# Patient Record
Sex: Female | Born: 1965
Health system: Southern US, Community
[De-identification: ages and names within clinical notes are randomized; demographics above are authoritative.]

## PROBLEM LIST (undated history)

## (undated) DIAGNOSIS — R87629 Unspecified abnormal cytological findings in specimens from vagina: Secondary | ICD-10-CM

## (undated) HISTORY — DX: Unspecified abnormal cytological findings in specimens from vagina: R87.629

## (undated) HISTORY — PX: CHOLECYSTECTOMY: SHX55

---

## 2018-05-16 ENCOUNTER — Other Ambulatory Visit: Payer: Self-pay

## 2018-05-16 ENCOUNTER — Emergency Department (HOSPITAL_COMMUNITY)
Admission: EM | Admit: 2018-05-16 | Discharge: 2018-05-16 | Disposition: A | Discharge status: Home / Self Care | Payer: Self-pay | Attending: Emergency Medicine | Admitting: Emergency Medicine

## 2018-05-16 ENCOUNTER — Emergency Department (HOSPITAL_COMMUNITY): Payer: Self-pay

## 2018-05-16 ENCOUNTER — Encounter (HOSPITAL_COMMUNITY): Payer: Self-pay | Admitting: *Deleted

## 2018-05-16 DIAGNOSIS — R197 Diarrhea, unspecified: Secondary | ICD-10-CM | POA: Insufficient documentation

## 2018-05-16 DIAGNOSIS — R319 Hematuria, unspecified: Secondary | ICD-10-CM

## 2018-05-16 DIAGNOSIS — F17290 Nicotine dependence, other tobacco product, uncomplicated: Secondary | ICD-10-CM | POA: Insufficient documentation

## 2018-05-16 DIAGNOSIS — N390 Urinary tract infection, site not specified: Secondary | ICD-10-CM | POA: Insufficient documentation

## 2018-05-16 LAB — CBC
HCT: 34.4 % — ABNORMAL LOW (ref 36.0–46.0)
Hemoglobin: 10.5 g/dL — ABNORMAL LOW (ref 12.0–15.0)
MCH: 27.4 pg (ref 26.0–34.0)
MCHC: 30.5 g/dL (ref 30.0–36.0)
MCV: 89.8 fL (ref 80.0–100.0)
Platelets: 360 10*3/uL (ref 150–400)
RBC: 3.83 MIL/uL — ABNORMAL LOW (ref 3.87–5.11)
RDW: 13 % (ref 11.5–15.5)
WBC: 6.2 10*3/uL (ref 4.0–10.5)
nRBC: 0 % (ref 0.0–0.2)

## 2018-05-16 LAB — COMPREHENSIVE METABOLIC PANEL
ALT: 16 U/L (ref 0–44)
AST: 16 U/L (ref 15–41)
Albumin: 3.9 g/dL (ref 3.5–5.0)
Alkaline Phosphatase: 71 U/L (ref 38–126)
Anion gap: 8 (ref 5–15)
BUN: 17 mg/dL (ref 6–20)
CO2: 24 mmol/L (ref 22–32)
Calcium: 9.1 mg/dL (ref 8.9–10.3)
Chloride: 109 mmol/L (ref 98–111)
Creatinine, Ser: 0.71 mg/dL (ref 0.44–1.00)
GFR calc Af Amer: >60 mL/min (ref >60)
GFR calc non Af Amer: >60 mL/min (ref >60)
Glucose, Bld: 85 mg/dL (ref 70–99)
Potassium: 4 mmol/L (ref 3.5–5.1)
Sodium: 141 mmol/L (ref 135–145)
Total Bilirubin: 0.6 mg/dL (ref 0.3–1.2)
Total Protein: 6.7 g/dL (ref 6.5–8.1)

## 2018-05-16 LAB — LIPASE, BLOOD: Lipase: 34 U/L (ref 11–51)

## 2018-05-16 LAB — URINALYSIS, ROUTINE W REFLEX MICROSCOPIC
Bilirubin Urine: NEGATIVE
Glucose, UA: NEGATIVE mg/dL
Ketones, ur: NEGATIVE mg/dL
Nitrite: NEGATIVE
Protein, ur: NEGATIVE mg/dL
Specific Gravity, Urine: 1.024 (ref 1.005–1.030)
pH: 5 (ref 5.0–8.0)

## 2018-05-16 LAB — TYPE AND SCREEN
ABO/RH(D): A POS
Antibody Screen: NEGATIVE

## 2018-05-16 LAB — ABO/RH: ABO/RH(D): A POS

## 2018-05-16 MED ORDER — CIPROFLOXACIN IN D5W 400 MG/200ML IV SOLN
400.0000 mg | Freq: Once | INTRAVENOUS | Status: AC
  Administered 2018-05-16: 400 mg via INTRAVENOUS
  Filled 2018-05-16: qty 200

## 2018-05-16 MED ORDER — SODIUM CHLORIDE 0.9% FLUSH: 3.0000 mL | Freq: Once | INTRAVENOUS | Status: DC

## 2018-05-16 MED ORDER — IOPAMIDOL (ISOVUE-300) INJECTION 61%
INTRAVENOUS | Status: AC
  Filled 2018-05-16: qty 100

## 2018-05-16 MED ORDER — CIPROFLOXACIN HCL 500 MG PO TABS: 500.0000 mg | ORAL_TABLET | Freq: Two times a day (BID) | ORAL | 0 refills | Status: DC

## 2018-05-16 MED ORDER — IOPAMIDOL (ISOVUE-300) INJECTION 61%
100.0000 mL | Freq: Once | INTRAVENOUS | Status: AC | PRN
  Administered 2018-05-16: 100 mL via INTRAVENOUS

## 2018-05-16 MED ORDER — OXYCODONE-ACETAMINOPHEN 5-325 MG PO TABS: 1.0000 | ORAL_TABLET | Freq: Three times a day (TID) | ORAL | 0 refills | Status: DC | PRN

## 2018-05-16 MED ORDER — SODIUM CHLORIDE (PF) 0.9 % IJ SOLN
INTRAMUSCULAR | Status: AC
  Filled 2018-05-16: qty 50

## 2018-05-16 MED ORDER — HYDROMORPHONE HCL 1 MG/ML IJ SOLN
1.0000 mg | Freq: Once | INTRAMUSCULAR | Status: AC
  Administered 2018-05-16: 1 mg via INTRAVENOUS
  Filled 2018-05-16: qty 1

## 2018-05-16 MED ORDER — SODIUM CHLORIDE 0.9 % IV BOLUS
1000.0000 mL | Freq: Once | INTRAVENOUS | Status: AC
  Administered 2018-05-16: 1000 mL via INTRAVENOUS

## 2018-05-16 MED ORDER — ONDANSETRON HCL 4 MG PO TABS: 4.0000 mg | ORAL_TABLET | Freq: Four times a day (QID) | ORAL | 0 refills | Status: DC

## 2018-05-16 NOTE — ED Notes (Signed)
Pt returned to room from CT

## 2018-05-16 NOTE — ED Triage Notes (Addendum)
Pt complains of bloody diarrhea, chills, left sided abdominal pain, nausea since yesterday evening after eating.

## 2018-05-16 NOTE — ED Provider Notes (Signed)
[facility]   CSN: [id number] Arrival date & time: 05/16/18  1129    History   Chief Complaint Chief Complaint  Patient presents with  . Rectal Bleeding  . Abdominal Pain  . Chills    HPI Wendy Miller is a 53 y.o. female.   HPI   52 year old female with several complaints.  Crampy lower abdominal pain.  Onset yesterday.  Persistent since then.  Associated with bloody diarrhea.  No fevers or chills.  Mild nausea.  No vomiting.  Dysuria and increased urinary frequency.  No sick contacts.  She is not anticoagulated.  Denies significant recent travel history, antibiotic usage.   History reviewed. No pertinent past medical history.  There are no active problems to display for this patient.   Past Surgical History:  Procedure Laterality Date  . CHOLECYSTECTOMY       OB History   No obstetric history on file.      Home Medications    Prior to Admission medications   Not on File    Family History No family history on file.  Social History Social History   Tobacco Use  . Smoking status: Current Every Day Smoker  . Smokeless tobacco: Never Used  Substance Use Topics  . Alcohol use: Not Currently  . Drug use: Not on file     Allergies   Patient has no allergy information on record.   Review of Systems Review of Systems  All systems reviewed and negative, other than as noted in HPI.  Physical Exam Updated Vital Signs BP 103/66 (BP Location: Right Arm)   Pulse 93   Temp 98.1 F (36.7 C) (Oral)   Resp 18   SpO2 98%   Physical Exam Vitals signs and nursing note reviewed.  Constitutional:      General: She is not in acute distress.    Appearance: She is well-developed.  HENT:     Head: Normocephalic and atraumatic.  Eyes:     General:        Right eye: No discharge.        Left eye: No discharge.     Conjunctiva/sclera: Conjunctivae normal.  Neck:     Musculoskeletal: Neck supple.    Cardiovascular:     Rate and Rhythm: Normal rate and regular rhythm.     Heart sounds: Normal heart sounds. No murmur. No friction rub. No gallop.   Pulmonary:     Effort: Pulmonary effort is normal. No respiratory distress.     Breath sounds: Normal breath sounds.  Abdominal:     General: There is no distension.     Palpations: Abdomen is soft.     Tenderness: There is abdominal tenderness.     Comments: Tenderness in the lower abdomen, somewhat worse suprapubically.  No rebound or guarding.  Musculoskeletal:        General: No tenderness.  Skin:    General: Skin is warm and dry.  Neurological:     Mental Status: She is alert.  Psychiatric:        Behavior: Behavior normal.        Thought Content: Thought content normal.      ED Treatments / Results  Labs (all labs ordered are listed, but only abnormal results are displayed) Labs Reviewed  URINE CULTURE - Abnormal; Notable for the following components:      Result Value   Culture >=100,000 COLONIES/mL ENTEROBACTER AEROGENES (*)    Organism ID, Bacteria ENTEROBACTER AEROGENES (*)  All other components within normal limits  CBC - Abnormal; Notable for the following components:   RBC 3.83 (*)    Hemoglobin 10.5 (*)    HCT 34.4 (*)    All other components within normal limits  URINALYSIS, ROUTINE W REFLEX MICROSCOPIC - Abnormal; Notable for the following components:   APPearance CLOUDY (*)    Hgb urine dipstick LARGE (*)    Leukocytes,Ua MODERATE (*)    Bacteria, UA MANY (*)    All other components within normal limits  LIPASE, BLOOD  COMPREHENSIVE METABOLIC PANEL  TYPE AND SCREEN  ABO/RH    EKG None  Radiology No results found.   Ct Abdomen Pelvis W Contrast  Result Date: 05/16/2018 CLINICAL DATA:  Bloody diarrhea, chills, left abdominal pain and nausea since last night after eating. EXAM: CT ABDOMEN AND PELVIS WITH CONTRAST TECHNIQUE: Multidetector CT imaging of the abdomen and pelvis was performed using  the standard protocol following bolus administration of intravenous contrast. CONTRAST:  ISOVUE-300 IOPAMIDOL (ISOVUE-300) INJECTION 61% COMPARISON:  None. FINDINGS: Lower chest: Mild dependent atelectasis bilaterally. Hepatobiliary: Cholecystectomy clips. Multiple liver cysts. Pancreas: Unremarkable. No pancreatic ductal dilatation or surrounding inflammatory changes. Spleen: Normal in size without focal abnormality. Adrenals/Urinary Tract: Adrenal glands are unremarkable. Kidneys are normal, without renal calculi, focal lesion, or hydronephrosis. Bladder is unremarkable. Stomach/Bowel: Mild diffuse fat density wall thickening involving the cecum and inferior portions of the right colon. The remainder of the colon, small bowel and stomach are unremarkable. Normal appearing appendix. There is also prominent stool in those portions of the colon Vascular/Lymphatic: Multiple mildly enlarged mesenteric lymph nodes. The largest is in the right lower quadrant, with a short axis diameter of 10 mm on image number 52 series 2. Mild atheromatous arterial calcifications without aneurysm. Reproductive: Uterus and bilateral adnexa are unremarkable. Other: Small umbilical hernia containing fat. Musculoskeletal: Mild levoconvex thoracolumbar scoliosis and minimal lumbar spine degenerative changes. IMPRESSION: 1. Mild diffuse fat density wall thickening involving the cecum and inferior portions of the right colon. This can be seen with previous colitis. There is no evidence of active colitis. 2. Mild mesenteric adenopathy, most likely reactive. Electronically Signed   By: Beckie Salts M.D.   On: 05/16/2018 14:43    Procedures Procedures (including critical care time)  Medications Ordered in ED Medications  ciprofloxacin (CIPRO) IVPB 400 mg (0 mg Intravenous Stopped 05/16/18 1450)  sodium chloride 0.9 % bolus 1,000 mL (0 mLs Intravenous Stopped 05/16/18 1607)  HYDROmorphone (DILAUDID) injection 1 mg (1 mg Intravenous  Given 05/16/18 1338)  iopamidol (ISOVUE-300) 61 % injection 100 mL (100 mLs Intravenous Contrast Given 05/16/18 1403)     Initial Impression / Assessment and Plan / ED Course  I have reviewed the triage vital signs and the nursing notes.  Pertinent labs & imaging results that were available during my care of the patient were reviewed by me and considered in my medical decision making (see chart for details).       53 year old female with dysuria and bloody diarrhea.  UA is consistent with UTI.  She will be placed on Cipro which will cover for possible bacterial infectious diarrhea as well.  She is afebrile.  Hemodynamically stable.  I feel she is appropriate for outpatient treatment.  Urine pain and nausea medicine.  Return precautions discussed.  Final Clinical Impressions(s) / ED Diagnoses   Final diagnoses:  Urinary tract infection with hematuria, site unspecified  Bloody diarrhea    ED Discharge Orders  Ordered    ciprofloxacin (CIPRO) 500 MG tablet  2 times daily     05/16/18 1553    oxyCODONE-acetaminophen (PERCOCET/ROXICET) 5-325 MG tablet  Every 8 hours PRN     05/16/18 1553    ondansetron (ZOFRAN) 4 MG tablet  Every 6 hours     05/16/18 1553           Raeford Razor, MD 05/21/18 1244

## 2018-05-16 NOTE — ED Notes (Signed)
Patient transported to CT 

## 2018-05-16 NOTE — ED Notes (Signed)
This RN attempted to gain IV access x2 unsuccessfully. Spencer RN to attempt to gain [place] IV access. Pt tolerated both attempts well and agrees to ultrasound IV attempt.

## 2018-05-19 LAB — URINE CULTURE: Culture: >=100000 — AB

## 2018-05-20 ENCOUNTER — Telehealth: Payer: Self-pay | Admitting: Emergency Medicine

## 2018-05-20 NOTE — Telephone Encounter (Signed)
Post ED Visit - Positive Culture Follow-up  Culture report reviewed by antimicrobial stewardship pharmacist: [facility] []  Enzo Bi, Pharm.D. []  Celedonio Miyamoto, Pharm.D., BCPS AQ-ID []  Garvin Fila, Pharm.D., BCPS []  Georgina Pillion, Pharm.D., BCPS []  [place], [place].D., BCPS, AAHIVP []  Estella Husk, Pharm.D., BCPS, AAHIVP []  Lysle Pearl, PharmD, BCPS []  Phillips Climes, PharmD, BCPS []  Agapito Games, PharmD, BCPS []  Verlan Friends, PharmD []  Mervyn Gay, PharmD, BCPS []  Vinnie Level, PharmD  [facility] [x]  Len Childs, PharmD []  Greer Pickerel, PharmD []  Adalberto Cole, PharmD []  Perlie Gold, Rph []  Lonell Face) Jean Rosenthal, PharmD []  Earl Many, PharmD []  Junita Push, PharmD []  Dorna Leitz, PharmD []  Terrilee Files, PharmD []  Lynann Beaver, PharmD []  Keturah Barre, PharmD []  Loralee Pacas, PharmD []  Bernadene Person, PharmD   Positive urine culture Treated with ciprofloxacin, organism sensitive to the same and no further patient follow-up is required at this time.  Berle Mull 05/20/2018, 1:33 PM

## 2018-06-17 ENCOUNTER — Other Ambulatory Visit: Payer: Self-pay

## 2018-06-17 ENCOUNTER — Emergency Department (HOSPITAL_COMMUNITY)
Admission: EM | Admit: 2018-06-17 | Discharge: 2018-06-17 | Disposition: A | Discharge status: Home / Self Care | Payer: No Typology Code available for payment source | Attending: Emergency Medicine | Admitting: Emergency Medicine

## 2018-06-17 ENCOUNTER — Emergency Department (HOSPITAL_COMMUNITY): Payer: No Typology Code available for payment source

## 2018-06-17 ENCOUNTER — Encounter (HOSPITAL_COMMUNITY): Payer: Self-pay

## 2018-06-17 DIAGNOSIS — M542 Cervicalgia: Secondary | ICD-10-CM | POA: Diagnosis present

## 2018-06-17 DIAGNOSIS — F17200 Nicotine dependence, unspecified, uncomplicated: Secondary | ICD-10-CM | POA: Insufficient documentation

## 2018-06-17 DIAGNOSIS — Z79899 Other long term (current) drug therapy: Secondary | ICD-10-CM | POA: Diagnosis not present

## 2018-06-17 DIAGNOSIS — M25511 Pain in right shoulder: Secondary | ICD-10-CM | POA: Diagnosis not present

## 2018-06-17 DIAGNOSIS — Y999 Unspecified external cause status: Secondary | ICD-10-CM | POA: Insufficient documentation

## 2018-06-17 DIAGNOSIS — Y9389 Activity, other specified: Secondary | ICD-10-CM | POA: Insufficient documentation

## 2018-06-17 DIAGNOSIS — Y92410 Unspecified street and highway as the place of occurrence of the external cause: Secondary | ICD-10-CM | POA: Insufficient documentation

## 2018-06-17 MED ORDER — METHOCARBAMOL 500 MG PO TABS: 500.0000 mg | ORAL_TABLET | Freq: Two times a day (BID) | ORAL | 0 refills | Status: DC

## 2018-06-17 MED ORDER — LIDOCAINE 5 % EX PTCH: 1.0000 | MEDICATED_PATCH | CUTANEOUS | 0 refills | Status: DC
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
## 2018-06-17 NOTE — Discharge Instructions (Signed)
You have been seen today for a shoulder injury. There were no acute abnormalities on the x-rays, including no sign of fracture or dislocation, however, there could be injuries to the soft tissues, such as the ligaments or tendons that are not seen on xrays. There could also be what are called occult fractures that are small fractures not seen on xray. Antiinflammatory medications: Take 600 mg of ibuprofen every 6 hours or 440 mg (over the counter dose) to 500 mg (prescription dose) of naproxen every 12 hours for the next 3 days. After this time, these medications may be used as needed for pain. Take these medications with food to avoid upset stomach. Choose only one of these medications, do not take them together. Acetaminophen (generic for Tylenol): Should you continue to have additional pain while taking the ibuprofen or naproxen, you may add in acetaminophen as needed. Your daily total maximum amount of acetaminophen from all sources should be limited to 4000mg /day for persons without liver problems, or 2000mg /day for those with liver problems. Robaxin: Robaxin is a muscle relaxer and can help relieve stiff muscles or muscle spasms.  Do not drive or perform other dangerous activities while taking the Robaxin. Ice: May apply ice to the area over the next 24 hours for 15 minutes at a time to reduce swelling. Elevation: Keep the extremity elevated as often as possible to reduce pain and inflammation. Support: Wear the shoulder sling for support and comfort. Wear this until pain resolves. Be sure to remove your arm from the sling multiple times a day and work it through the range of motion of the shoulder to avoid a condition called adhesive capsulitis (frozen shoulder). Exercises: Start by performing these exercises a few times a week, increasing the frequency until you are performing them twice daily.  Follow up: If symptoms are improving, you may follow up with your primary care provider for any continued  management. If symptoms are not starting to improve within a week, you should follow up with the orthopedic specialist within two weeks. Return: Return to the ED for numbness, weakness, increasing pain, overall worsening symptoms, loss of function, or if symptoms are not improving, you have tried to follow up with the orthopedic specialist, and have been unable to do so.  For prescription assistance, may try using prescription discount sites or apps, such as goodrx.com
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
## 2018-06-17 NOTE — ED Notes (Signed)
Pt c/o neck/shoulder pain. Pt given cervical collar

## 2019-10-23 ENCOUNTER — Emergency Department (HOSPITAL_COMMUNITY): Payer: Self-pay

## 2019-10-23 ENCOUNTER — Other Ambulatory Visit: Payer: Self-pay

## 2019-10-23 ENCOUNTER — Encounter (HOSPITAL_COMMUNITY): Payer: Self-pay

## 2019-10-23 ENCOUNTER — Emergency Department (HOSPITAL_COMMUNITY)
Admission: EM | Admit: 2019-10-23 | Discharge: 2019-10-23 | Disposition: A | Discharge status: Home / Self Care | Payer: Self-pay | Attending: Emergency Medicine | Admitting: Emergency Medicine

## 2019-10-23 DIAGNOSIS — J302 Other seasonal allergic rhinitis: Secondary | ICD-10-CM

## 2019-10-23 DIAGNOSIS — F17200 Nicotine dependence, unspecified, uncomplicated: Secondary | ICD-10-CM | POA: Insufficient documentation

## 2019-10-23 MED ORDER — AZELASTINE HCL 0.05 % OP SOLN: 1.0000 [drp] | Freq: Two times a day (BID) | OPHTHALMIC | 2 refills | Status: DC

## 2019-10-23 NOTE — ED Provider Notes (Signed)
[facility]-EMERGENCY DEPT Provider Note   CSN: [id number] Arrival date & time: 10/23/19  2671     History Chief Complaint  Patient presents with  . Eye Problem  . Headache    Wendy Miller is a 54 y.o. female.  HPI She complains of watering from her left eye, for 1 week.  She awoke this morning without left periorbital headache.  Several months ago she was diagnosed with Bell's palsy, at which time she had facial weakness and drainage from her left eye.  The drainage from the left eye lasted a couple weeks at that time then resolved.  She denies sinus symptoms currently.  She denies fever, chills, nausea, vomiting, weakness, dizziness, paresthesia.  There are no other known modifying factors.    History reviewed. No pertinent past medical history.  There are no problems to display for this patient.   Past Surgical History:  Procedure Laterality Date  . CHOLECYSTECTOMY       OB History   No obstetric history on file.     History reviewed. No pertinent family history.  Social History   Tobacco Use  . Smoking status: Current Every Day Smoker  . Smokeless tobacco: Never Used  Substance Use Topics  . Alcohol use: Not Currently  . Drug use: Not on file    Home Medications Prior to Admission medications   Medication Sig Start Date End Date Taking? Authorizing Provider  azelastine (OPTIVAR) 0.05 % ophthalmic solution Place 1 drop into both eyes 2 (two) times daily. 10/23/19   Mancel Bale, MD  ciprofloxacin (CIPRO) 500 MG tablet Take 1 tablet (500 mg total) by mouth 2 (two) times daily. Patient not taking: Reported on 10/23/2019 05/16/18   Raeford Razor, MD  lidocaine (LIDODERM) 5 % Place 1 patch onto the skin daily. Remove & Discard patch within 12 hours or as directed by MD Patient not taking: Reported on 10/23/2019 06/17/18   Joy, Hillard Danker, PA-C  methocarbamol (ROBAXIN) 500 MG tablet Take 1 tablet (500 mg total) by mouth 2 (two) times daily. Patient  not taking: Reported on 10/23/2019 06/17/18   Joy, Shawn C, PA-C  ondansetron (ZOFRAN) 4 MG tablet Take 1 tablet (4 mg total) by mouth every 6 (six) hours. Patient not taking: Reported on 10/23/2019 05/16/18   Raeford Razor, MD  oxyCODONE-acetaminophen (PERCOCET/ROXICET) 5-325 MG tablet Take 1 tablet by mouth every 8 (eight) hours as needed for severe pain. Patient not taking: Reported on 10/23/2019 05/16/18   Raeford Razor, MD    Allergies    Patient has no known allergies.  Review of Systems   Review of Systems  All other systems reviewed and are negative.   Physical Exam Updated Vital Signs BP 107/89 (BP Location: Left Arm)   Pulse 63   Temp 98.4 F (36.9 C) (Oral)   Resp 17   SpO2 99%   Physical Exam Vitals and nursing note reviewed.  Constitutional:      General: She is not in acute distress.    Appearance: She is well-developed. She is not ill-appearing, toxic-appearing or diaphoretic.  HENT:     Head: Normocephalic and atraumatic.     Right Ear: External ear normal.     Left Ear: External ear normal.     Nose: Nose normal.     Mouth/Throat:     Mouth: Mucous membranes are moist.     Pharynx: No oropharyngeal exudate or posterior oropharyngeal erythema.  Eyes:     Conjunctiva/sclera: Conjunctivae normal.  Pupils: Pupils are equal, round, and reactive to light.     Comments: Mild tearing left eye.  No hyphema, no conjunctivitis, no purulent discharge.  External ocular muscles are intact.  No pain with eye movement.  Neck:     Trachea: Phonation normal.  Cardiovascular:     Rate and Rhythm: Normal rate and regular rhythm.     Heart sounds: Normal heart sounds.  Pulmonary:     Effort: Pulmonary effort is normal.     Breath sounds: Normal breath sounds.  Abdominal:     General: There is no distension.  Musculoskeletal:        General: No swelling or tenderness. Normal range of motion.     Cervical back: Normal range of motion and neck supple.  Skin:    General:  Skin is warm and dry.     Coloration: Skin is not jaundiced or pale.  Neurological:     Mental Status: She is alert and oriented to person, place, and time.     Cranial Nerves: No cranial nerve deficit.     Sensory: No sensory deficit.     Motor: No abnormal muscle tone.     Coordination: Coordination normal.  Psychiatric:        Mood and Affect: Mood normal.        Behavior: Behavior normal.        Thought Content: Thought content normal.        Judgment: Judgment normal.     ED Results / Procedures / Treatments   Labs (all labs ordered are listed, but only abnormal results are displayed) Labs Reviewed - No data to display  EKG None  Radiology CT Head Wo Contrast  Result Date: 10/23/2019 CLINICAL DATA:  Headache.  Left eye drainage. EXAM: CT HEAD WITHOUT CONTRAST TECHNIQUE: Contiguous axial images were obtained from the base of the skull through the vertex without intravenous contrast. COMPARISON:  None. FINDINGS: Brain: The brain shows a normal appearance without evidence of malformation, atrophy, old or acute small or large vessel infarction, mass lesion, hemorrhage, hydrocephalus or extra-axial collection. Vascular: There is atherosclerotic calcification of the major vessels at the base of the brain. Skull: Normal.  No traumatic finding.  No focal bone lesion. Sinuses/Orbits: Sinuses are clear. Orbits appear normal. No evidence of left orbital pathology by CT. Mastoids are clear. Other: None significant IMPRESSION: Normal head CT. No abnormality seen to explain headache. There is atherosclerotic calcification of the major vessels at the base of the brain. Electronically Signed   By: Paulina Fusi M.D.   On: 10/23/2019 13:03    Procedures Procedures (including critical care time)  Medications Ordered in ED Medications - No data to display  ED Course  I have reviewed the triage vital signs and the nursing notes.  Pertinent labs & imaging results that were available during my  care of the patient were reviewed by me and considered in my medical decision making (see chart for details).    MDM Rules/Calculators/A&P                           Patient Vitals for the past 24 hrs:  BP Temp Temp src Pulse Resp SpO2  10/23/19 1300 107/89 -- -- -- 17 99 %  10/23/19 1149 (!) 147/93 98.4 F (36.9 C) Oral 63 16 99 %  10/23/19 0846 (!) 140/99 98.4 F (36.9 C) Oral 73 16 94 %    At discharge reevaluation  with update and discussion. After initial assessment and treatment, an updated evaluation reveals she is comfortable has no further complaints. Mancel Bale   Medical Decision Making:  This patient is presenting for evaluation of drainage from right eye and headache, which does require a range of treatment options, and is a complaint that involves a moderate risk of morbidity and mortality. The differential diagnoses include sinusitis, allergic reaction migraine headache. I decided to review old records, and in summary patient with recurrent headache presenting with eye drainage, and normal neurologic exam.  I did not require additional historical information from anyone.   Radiologic Tests Ordered, included CT head.  I independently Visualized: Radiographic images, which show no sinusitis or intracranial abnormality    Critical Interventions-clinical evaluation, CT imaging, observation reassessment  After These Interventions, the Patient was reevaluated and was found stable for discharge.  No evidence for CVA, sinusitis, Bell's palsy, or acute bacterial infection  CRITICAL CARE-no Performed by: Mancel Bale  Nursing Notes Reviewed/ Care Coordinated Applicable Imaging Reviewed Interpretation of Laboratory Data incorporated into ED treatment  The patient appears reasonably screened and/or stabilized for discharge and I doubt any other medical condition or other [facility] requiring further screening, evaluation, or treatment in the ED at this time prior to  discharge.  Plan: Home Medications-symptomatic treatment as needed; Home Treatments-rest, fluids; return here if the recommended treatment, does not improve the symptoms; Recommended follow up-PCP, as needed     Final Clinical Impression(s) / ED Diagnoses Final diagnoses:  Seasonal allergies    Rx / DC Orders ED Discharge Orders         Ordered    azelastine (OPTIVAR) 0.05 % ophthalmic solution  2 times daily     Discontinue  Reprint     10/23/19 1359           Mancel Bale, MD 10/23/19 [phone]

## 2019-10-23 NOTE — Discharge Instructions (Signed)
It appears that your high drainage is from allergy.  We are prescribing an antihistamine drop to help that discomfort.  You can use a warm compress on the area of discomfort 2 or 3 times a day which should help.  Follow-up with your primary care doctor if not better in a few days.

## 2019-10-23 NOTE — ED Triage Notes (Signed)
Pt reports c/o left eye drainage for a week and a half. Pt also complaining of headache on left side. History of Bells palsy

## 2020-08-30 ENCOUNTER — Encounter (HOSPITAL_COMMUNITY): Payer: Self-pay

## 2020-08-30 ENCOUNTER — Emergency Department (HOSPITAL_COMMUNITY)
Admission: EM | Admit: 2020-08-30 | Discharge: 2020-08-30 | Disposition: A | Discharge status: Home / Self Care | Payer: No Typology Code available for payment source | Attending: Emergency Medicine | Admitting: Emergency Medicine

## 2020-08-30 ENCOUNTER — Emergency Department (HOSPITAL_COMMUNITY): Payer: No Typology Code available for payment source

## 2020-08-30 ENCOUNTER — Other Ambulatory Visit: Payer: Self-pay

## 2020-08-30 DIAGNOSIS — M25562 Pain in left knee: Secondary | ICD-10-CM | POA: Diagnosis present

## 2020-08-30 DIAGNOSIS — M25469 Effusion, unspecified knee: Secondary | ICD-10-CM

## 2020-08-30 DIAGNOSIS — F17210 Nicotine dependence, cigarettes, uncomplicated: Secondary | ICD-10-CM | POA: Insufficient documentation

## 2020-08-30 MED ORDER — DICLOFENAC SODIUM 1 % EX GEL: 2.0000 g | Freq: Four times a day (QID) | CUTANEOUS | 0 refills | Status: DC

## 2020-08-30 NOTE — ED Provider Notes (Signed)
[facility]-EMERGENCY DEPT Provider Note   CSN: [id number] Arrival date & time: 08/30/20  1349     History Chief Complaint  Patient presents with   Joint Swelling    Wendy Miller is a 55 y.o. female with no pertinent past medical history.  Patient presents to the emerged part with a chief complaint of pain and swelling to her left knee.  Pain and swelling started on Saturday 6/18.  Symptoms have gradually worsened since then.  Patient rates pain 8/10 on the pain scale.  Pain is worse with movement or touch.  Patient has had minimal improvement with Motrin.  Patient denies any recent falls or injuries.  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  She denies any fevers or chills.  HPI     History reviewed. No pertinent past medical history.  There are no problems to display for this patient.   Past Surgical History:  Procedure Laterality Date   CESAREAN SECTION     CHOLECYSTECTOMY       OB History   No obstetric history on file.     Family History  Problem Relation Age of Onset   Diabetes Father    Hypertension Father     Social History   Tobacco Use   Smoking status: Every Day    Packs/day: 0.50    Pack years: 0.00    Types: Cigarettes   Smokeless tobacco: Never  Vaping Use   Vaping Use: Never used  Substance Use Topics   Alcohol use: Not Currently   Drug use: Never    Home Medications Prior to Admission medications   Medication Sig Start Date End Date Taking? Authorizing Provider  azelastine (OPTIVAR) 0.05 % ophthalmic solution Place 1 drop into both eyes 2 (two) times daily. 10/23/19   Mancel Bale, MD  ciprofloxacin (CIPRO) 500 MG tablet Take 1 tablet (500 mg total) by mouth 2 (two) times daily. Patient not taking: Reported on 10/23/2019 05/16/18   Raeford Razor, MD  lidocaine  (LIDODERM) 5 % Place 1 patch onto the skin daily. Remove & Discard patch within 12 hours or as directed by MD Patient not taking: Reported on 10/23/2019 06/17/18   Joy, Hillard Danker, PA-C  methocarbamol (ROBAXIN) 500 MG tablet Take 1 tablet (500 mg total) by mouth 2 (two) times daily. Patient not taking: Reported on 10/23/2019 06/17/18   Joy, Shawn C, PA-C  ondansetron (ZOFRAN) 4 MG tablet Take 1 tablet (4 mg total) by mouth every 6 (six) hours. Patient not taking: Reported on 10/23/2019 05/16/18   Raeford Razor, MD  oxyCODONE-acetaminophen (PERCOCET/ROXICET) 5-325 MG tablet Take 1 tablet by mouth every 8 (eight) hours as needed for severe pain. Patient not taking: Reported on 10/23/2019 05/16/18   Raeford Razor, MD    Allergies    Patient has no known allergies.  Review of Systems   Review of Systems  Constitutional:  Negative for chills and fever.  Musculoskeletal:  Positive for arthralgias and joint swelling.  Skin:  Negative for color change, pallor, rash and wound.  Neurological:  Negative for weakness and numbness.   Physical Exam Updated Vital Signs BP (!) 149/94 (BP Location: Left Arm)   Pulse 62   Temp 98.3 F (36.8 C) (Oral)   Resp 18   Ht 5\' 8"  (1.727 m)   Wt 83.5 kg  SpO2 92%   BMI 27.98 kg/m   Physical Exam Vitals and nursing note reviewed.  Constitutional:      General: She is not in acute distress.    Appearance: She is not ill-appearing, toxic-appearing or diaphoretic.  HENT:     Head: Normocephalic.  Eyes:     General: No scleral icterus.       Right eye: No discharge.        Left eye: No discharge.  Cardiovascular:     Rate and Rhythm: Normal rate.     Pulses:          Dorsalis pedis pulses are 3+ on the right side.  Pulmonary:     Effort: Pulmonary effort is normal.  Musculoskeletal:     Left knee: Swelling, effusion and bony tenderness present. No deformity, erythema, ecchymosis, lacerations or crepitus. Normal range of motion. Tenderness (diffuse throughout  knee) present. Normal alignment.     Right lower leg: Normal.     Left lower leg: Normal.     Right ankle: No swelling, deformity, ecchymosis or lacerations. No tenderness. Normal range of motion.     Left ankle: No swelling, deformity, ecchymosis or lacerations. No tenderness. Normal range of motion.     Left foot: Normal range of motion and normal capillary refill. No swelling, deformity, laceration, tenderness or bony tenderness. Normal pulse.     Comments: She has full extension and flexion to left knee.  No palpable defect to quadriceps tendon or patella tendon.  Feet:     Right foot:     Skin integrity: Skin integrity normal.     Toenail Condition: Right toenails are normal.  Skin:    General: Skin is warm and dry.  Neurological:     General: No focal deficit present.     Mental Status: She is alert.  Psychiatric:        Behavior: Behavior is cooperative.    ED Results / Procedures / Treatments   Labs (all labs ordered are listed, but only abnormal results are displayed) Labs Reviewed - No data to display  EKG None  Radiology DG Knee Complete 4 Views Left  Result Date: 08/30/2020 CLINICAL DATA:  LEFT knee swelling since last night, no known injury EXAM: LEFT KNEE - COMPLETE 4+ VIEW COMPARISON:  None FINDINGS: Osseous mineralization low normal. Joint spaces appear preserved though a few subchondral cystic changes are present at the lateral femoral condyle. Small knee joint effusion present. No acute fracture or dislocation. IMPRESSION: Mild degenerative changes at the patellofemoral joint with associated joint effusion. No acute osseous abnormalities. Electronically Signed   By: Ulyses Southward M.D.   On: 08/30/2020 16:24    Procedures Procedures   Medications Ordered in ED Medications - No data to display  ED Course  I have reviewed the triage vital signs and the nursing notes.  Pertinent labs & imaging results that were available during my care of the patient were  reviewed by me and considered in my medical decision making (see chart for details).    MDM Rules/Calculators/A&P                          Alert 55 year old female no acute stress, nontoxic-appearing.  Patient presents to the emergency department with a chief complaint of pain and swelling to left knee.  Symptoms have been present since 6/18.    Patient denies any erythema or systemic symptoms.  On physical exam patient has no  erythema or warmth noted to the left knee.  Low suspicion for septic arthritis at this time.  Patient has full range of motion to left knee.  Patient has diffuse tenderness and effusion throughout left knee.  Pulse, motor, and sensation intact distally.  X-ray obtained shows mild degenerative changes at the patellofemoral joint with associated joint effusion.  No acute osseous abnormalities.  Will give patient knee brace.  Patient prescribed Voltaren gel.  Patient advised to elevate and ice knee as much as possible.  We will have patient follow-up with [facility].  Patient given strict return precautions.  Patient expressed understanding of all instructions and is agreeable with this plan.  Final Clinical Impression(s) / ED Diagnoses Final diagnoses:  Acute pain of left knee  Knee swelling    Rx / DC Orders ED Discharge Orders          Ordered    diclofenac Sodium (VOLTAREN) 1 % GEL  4 times daily        08/30/20 1829             Berneice Heinrich 08/30/20 1836    Lorre Nick, MD 09/02/20 1546

## 2020-08-30 NOTE — ED Triage Notes (Signed)
Patient c/o left knee swelling since last night. Patient denies any specific injury.

## 2020-08-30 NOTE — Discharge Instructions (Addendum)
You came to the emerge apartment today to be evaluated for your left knee pain and swelling.  Your physical exam was reassuring.  Your x-ray showed mild degenerative changes and some fluid around your knee.  Please follow-up with the orthopedic provider have given you.  Get help right away if: You have swelling or redness in your knee that gets worse or does not get better. You have severe pain in your knee.

## 2020-09-29 ENCOUNTER — Other Ambulatory Visit: Payer: Self-pay | Admitting: Internal Medicine

## 2020-09-29 DIAGNOSIS — Z13820 Encounter for screening for osteoporosis: Secondary | ICD-10-CM

## 2020-09-29 DIAGNOSIS — Z1231 Encounter for screening mammogram for malignant neoplasm of breast: Secondary | ICD-10-CM

## 2020-11-03 ENCOUNTER — Other Ambulatory Visit (HOSPITAL_COMMUNITY): Payer: Self-pay

## 2020-11-03 MED ORDER — DICLOFENAC SODIUM 1 % EX GEL
CUTANEOUS | 2 refills | Status: AC
  Filled 2020-11-03: qty 200, 25d supply, fill #0

## 2020-11-08 ENCOUNTER — Other Ambulatory Visit (HOSPITAL_COMMUNITY): Payer: Self-pay

## 2020-11-08 ENCOUNTER — Other Ambulatory Visit: Payer: Self-pay

## 2020-11-08 ENCOUNTER — Encounter: Payer: Self-pay | Admitting: Advanced Practice Midwife

## 2020-11-08 ENCOUNTER — Ambulatory Visit (INDEPENDENT_AMBULATORY_CARE_PROVIDER_SITE_OTHER): Payer: No Typology Code available for payment source | Admitting: Advanced Practice Midwife

## 2020-11-08 VITALS — BP 138/91 | HR 77 | Ht 67.0 in | Wt 177.0 lb

## 2020-11-08 DIAGNOSIS — R87820 Cervical low risk human papillomavirus (HPV) DNA test positive: Secondary | ICD-10-CM

## 2020-11-08 DIAGNOSIS — Z01419 Encounter for gynecological examination (general) (routine) without abnormal findings: Secondary | ICD-10-CM

## 2020-11-08 DIAGNOSIS — N951 Menopausal and female climacteric states: Secondary | ICD-10-CM | POA: Diagnosis not present

## 2020-11-08 NOTE — Progress Notes (Signed)
Subjective:     Wendy Miller is a 55 y.o. female here at [facility] for a routine exam.  Current complaints: hot flashes.  Personal health questionnaire reviewed: yes.  Do you have a primary care provider? [facility] Do you feel safe at home? Yes.  Patient experiencing hot flashes that started 1.5 years ago. Patient states her hot flashes are severe. She has tried non medical remedies including using breathable clothing, sleeping with a fan, using wet cloth during flashes, but has not had any relief. She would like to discuss treatment. Patients last LMP was 2 years ago. She has no other gynecological concerns, She denies vaginal itching or dryness. Denies vaginal bleeding or pelvic pain.    Health Maintenance Due  Topic Date Due   Pneumococcal Vaccine 83-77 Years old (1 - PCV) Never done   HIV Screening  Never done   Hepatitis C Screening  Never done   COLONOSCOPY (Pts 45-68yrs Insurance coverage will need to be confirmed)  Never done   MAMMOGRAM  Never done   Zoster Vaccines- Shingrix (1 of 2) Never done   COVID-19 Vaccine (3 - Booster) 05/06/2020   INFLUENZA VACCINE  10/11/2020     Risk factors for chronic health problems: Smoking: current smoker, 1 pack day Alchohol/how much: none Pt BMI: Body mass index is 27.72 kg/m.   Gynecologic History No LMP recorded. Patient is postmenopausal. Contraception: condoms Last Pap: 05/2019. Results were: normal Last mammogram: scheduled for november. Results were: pending  Obstetric History OB History  Gravida Para Term Preterm AB Living  3 3 3  0 0 4  SAB IAB Ectopic Multiple Live Births  0 0 0 1 3    # Outcome Date GA Lbr Len/2nd Weight Sex Delivery Anes PTL Lv  3 Term           2 Term           1 Term              The following portions of the patient's history were reviewed and updated as appropriate: current medications, past family history, past medical history, past social history, and past surgical  history.  Review of Systems Pertinent items noted in HPI and remainder of comprehensive ROS otherwise negative.    Objective:   BP (!) 138/91   Pulse 77   Ht 5\' 7"  (1.702 m)   Wt 177 lb (80.3 kg)   BMI 27.72 kg/m  VS reviewed, nursing note reviewed,  Constitutional: well developed, well nourished, no distress HEENT: normocephalic CV: normal rate Pulm/chest wall: normal effort Breast Exam:  Right breast normal without masses, skin or nipple changes or axillary nodes, left breast normal without mass, skin or nipple changes or axillary nodes Abdomen: soft Neuro: alert and oriented x 3 Skin: warm, dry Psych: affect normal Pelvic exam: Cervix pink, visually closed, without lesion, vaginal walls and external genitalia normal Bimanual exam: Cervix firm, anterior, neg CMT, uterus nontender, nonenlarged, adnexa without tenderness, enlargement, or mass     Assessment/Plan:  Hot flashes in setting of menopause -Patient does not have additional symptoms of menopause but is having severe hot flashes. She would like to start HRT to treat hot flashes. Given her elevated BP today and cHTN will recommend she sees MD to start treatment. -schedule appointment in 1-2 month to see MD -discussed interventions to help reduce/treat hot flashes  2.. Well woman exam with routine gynecological exam -Doing well today, no other concerns besides hot flashes  addressed above. -Having no discomfort or abnormal bleeding. Was given return precautions. -Patient records were requested to confirm normal pap and colposcopy results, will defer further testing until these records are obtained.   3. Screen for STD (sexually transmitted disease) -Patient is low risk, she does not want STD testing today.   Follow up in:  1-2  months or as needed.   Brock Bad, Medical Student 4:15 PM

## 2020-11-08 NOTE — Progress Notes (Signed)
   Subjective:     Tanis Hensarling is a 55 y.o. female here at [facility] for a routine exam.  Current complaints: hot flashes.  Personal health questionnaire reviewed: yes.  Do you have a primary care provider? yes Do you feel safe at home? yes    Health Maintenance Due  Topic Date Due   Pneumococcal Vaccine 6-62 Years old (1 - PCV) Never done   HIV Screening  Never done   Hepatitis C Screening  Never done   COLONOSCOPY (Pts 45-39yrs Insurance coverage will need to be confirmed)  Never done   MAMMOGRAM  Never done   Zoster Vaccines- Shingrix (1 of 2) Never done   COVID-19 Vaccine (3 - Booster) 05/06/2020   INFLUENZA VACCINE  10/11/2020     Risk factors for chronic health problems: Smoking: No Alchohol/how much: Not asked Pt BMI: Body mass index is 27.72 kg/m.   Gynecologic History No LMP recorded. Patient is postmenopausal. Contraception: post menopausal status Last Pap: 05/12/19. Results were: normal, with HPV positive , negative 16, 18 Last mammogram: 2021. Results were: normal  Obstetric History OB History  Gravida Para Term Preterm AB Living  3 3 3  0 0 4  SAB IAB Ectopic Multiple Live Births  0 0 0 1 3    # Outcome Date GA Lbr Len/2nd Weight Sex Delivery Anes PTL Lv  3 Term           2 Term           1 Term              The following portions of the patient's history were reviewed and updated as appropriate: allergies, current medications, past family history, past medical history, past social history, past surgical history, and problem list.  Review of Systems Pertinent items noted in HPI and remainder of comprehensive ROS otherwise negative.    Objective:   BP (!) 138/91   Pulse 77   Ht 5\' 7"  (1.702 m)   Wt 177 lb (80.3 kg)   BMI 27.72 kg/m  VS reviewed, nursing note reviewed,  Constitutional: well developed, well nourished, no distress HEENT: normocephalic CV: normal rate Pulm/chest wall: normal effort Breast Exam:  exam performed: right breast  normal without mass, skin or nipple changes or axillary nodes, left breast normal without mass, skin or nipple changes or axillary nodes Abdomen: soft Neuro: alert and oriented x 3 Skin: warm, dry Psych: affect normal Pelvic exam:Bimanual exam: Cervix 0/long/high, firm, anterior, neg CMT, uterus nontender, nonenlarged, adnexa without tenderness, enlargement, or mass       Assessment/Plan:   1. Well woman exam with routine gynecological exam --Overall doing well, last menses 2 years ago  2. Menopausal vasomotor syndrome --Hot flashes x 2 years, bothersome for patient, has tried cotton clothing, cool cloth at bedside, fan. --HTN recently diagnosed, pt mildly hypertensive today --Pt without anxiety /depression, does not want to take these medications unless necessary --F/U with MD to review options  3. Pap smear of cervix shows low risk HPV present --[facility] records initially unclear. Pt had normal pap, with negative hpv 16/18 but had colpo in 2021.  Pap not done at today's visit. --Chart review reveals positive HPV x 2 paps, negative HRHPV with normal cytology so colpo recommended. Colpo wnl. --Pap is due now, repeat at follow up visit with MD   Follow up in: 2 months or as needed.   08-02-1970, CNM 6:25 PM

## 2020-11-08 NOTE — Progress Notes (Signed)
Pt presents as new patient for yearly exam. LMP/BCM: PM Last pap: 05/2019 WNL, however pt had colposcopy with Neg HR HPV, and neg 16/18. Pt does complain of hot flashes.

## 2020-11-23 ENCOUNTER — Ambulatory Visit
Admission: RE | Admit: 2020-11-23 | Discharge: 2020-11-23 | Disposition: A | Discharge status: Home / Self Care | Payer: No Typology Code available for payment source | Source: Ambulatory Visit | Attending: Internal Medicine | Admitting: Internal Medicine

## 2020-11-23 ENCOUNTER — Other Ambulatory Visit: Payer: Self-pay

## 2020-11-23 DIAGNOSIS — Z1231 Encounter for screening mammogram for malignant neoplasm of breast: Secondary | ICD-10-CM

## 2020-11-30 ENCOUNTER — Other Ambulatory Visit (HOSPITAL_COMMUNITY): Payer: Self-pay

## 2020-12-02 ENCOUNTER — Other Ambulatory Visit (HOSPITAL_COMMUNITY): Payer: Self-pay

## 2020-12-02 MED ORDER — HYDROCHLOROTHIAZIDE 12.5 MG PO TABS
ORAL_TABLET | ORAL | 3 refills | Status: AC
  Filled 2020-12-02: qty 90, 90d supply, fill #0

## 2020-12-16 ENCOUNTER — Institutional Professional Consult (permissible substitution): Payer: No Typology Code available for payment source | Admitting: Obstetrics and Gynecology

## 2021-02-24 ENCOUNTER — Other Ambulatory Visit (HOSPITAL_COMMUNITY): Payer: Self-pay

## 2021-02-24 MED ORDER — HYDROCHLOROTHIAZIDE 12.5 MG PO TABS
ORAL_TABLET | ORAL | 2 refills | Status: DC
  Filled 2021-02-24: qty 90, 90d supply, fill #0

## 2021-03-18 ENCOUNTER — Other Ambulatory Visit: Payer: No Typology Code available for payment source

## 2021-06-06 ENCOUNTER — Other Ambulatory Visit: Payer: Self-pay

## 2021-06-06 DIAGNOSIS — F17210 Nicotine dependence, cigarettes, uncomplicated: Secondary | ICD-10-CM

## 2021-06-06 DIAGNOSIS — Z87891 Personal history of nicotine dependence: Secondary | ICD-10-CM

## 2021-06-07 ENCOUNTER — Other Ambulatory Visit (HOSPITAL_COMMUNITY): Payer: Self-pay

## 2021-06-07 MED ORDER — HYDROCHLOROTHIAZIDE 12.5 MG PO TABS
ORAL_TABLET | ORAL | 2 refills | Status: DC
  Filled 2021-06-07: qty 90, 90d supply, fill #0
  Filled 2021-09-08: qty 90, 90d supply, fill #1
  Filled 2022-01-13: qty 90, 90d supply, fill #2

## 2021-06-07 MED ORDER — ATORVASTATIN CALCIUM 10 MG PO TABS
ORAL_TABLET | ORAL | 2 refills | Status: DC
  Filled 2021-06-07: qty 90, 90d supply, fill #0
  Filled 2021-09-08: qty 90, 90d supply, fill #1
  Filled 2022-01-13: qty 90, 90d supply, fill #2

## 2021-06-21 ENCOUNTER — Encounter: Payer: Self-pay | Admitting: Acute Care

## 2021-06-21 ENCOUNTER — Ambulatory Visit: Payer: No Typology Code available for payment source

## 2021-06-21 ENCOUNTER — Ambulatory Visit (INDEPENDENT_AMBULATORY_CARE_PROVIDER_SITE_OTHER): Payer: No Typology Code available for payment source | Admitting: Acute Care

## 2021-06-21 ENCOUNTER — Ambulatory Visit
Admission: RE | Admit: 2021-06-21 | Discharge: 2021-06-21 | Disposition: A | Discharge status: Home / Self Care | Payer: No Typology Code available for payment source | Source: Ambulatory Visit | Attending: Acute Care | Admitting: Acute Care

## 2021-06-21 ENCOUNTER — Other Ambulatory Visit: Payer: No Typology Code available for payment source

## 2021-06-21 DIAGNOSIS — F17210 Nicotine dependence, cigarettes, uncomplicated: Secondary | ICD-10-CM

## 2021-06-21 DIAGNOSIS — Z87891 Personal history of nicotine dependence: Secondary | ICD-10-CM

## 2021-06-21 NOTE — Patient Instructions (Signed)
Thank you for participating in the [facility]. It was our pleasure to meet you today. We will call you with the results of your scan within the next few days. Your scan will be assigned a Lung RADS category score by the physicians reading the scans.  This Lung RADS score determines follow up scanning.  See below for description of categories, and follow up screening recommendations. We will be in touch to schedule your follow up screening annually or based on recommendations of our providers. We will fax a copy of your scan results to your Primary Care Physician, or the physician who referred you to the program, to ensure they have the results. Please call the office if you have any questions or concerns regarding your scanning experience or results.  Our office number is [phone]. Please speak with Denise Phelps, RN. , or  Denise Buckner RN, They are  our Lung Cancer Screening RN.'s If They are unavailable when you call, Please leave a message on the voice mail. We will return your call at our earliest convenience.This voice mail is monitored several times a day.  Remember, if your scan is normal, we will scan you annually as long as you continue to meet the criteria for the program. (Age 55-77, Current smoker or smoker who has quit within the last 15 years). If you are a smoker, remember, quitting is the single most powerful action that you can take to decrease your risk of lung cancer and other pulmonary, breathing related problems. We know quitting is hard, and we are here to help.  Please let us know if there is anything we can do to help you meet your goal of quitting. If you are a former smoker, congratulations. We are proud of you! Remain smoke free! Remember you can refer friends or family members through the number above.  We will screen them to make sure they meet criteria for the program. Thank you for helping us take better care of you by  participating in Lung Screening.  You can receive free nicotine replacement therapy ( patches, gum or mints) by calling 1-800-QUIT NOW. Please call so we can get you on the path to becoming  a non-smoker. I know it is hard, but you can do this!  Lung RADS Categories:  Lung RADS 1: no nodules or definitely non-concerning nodules.  Recommendation is for a repeat annual scan in 12 months.  Lung RADS 2:  nodules that are non-concerning in appearance and behavior with a very low likelihood of becoming an active cancer. Recommendation is for a repeat annual scan in 12 months.  Lung RADS 3: nodules that are probably non-concerning , includes nodules with a low likelihood of becoming an active cancer.  Recommendation is for a 6-month repeat screening scan. Often noted after an upper respiratory illness. We will be in touch to make sure you have no questions, and to schedule your 6-month scan.  Lung RADS 4 A: nodules with concerning findings, recommendation is most often for a follow up scan in 3 months or additional testing based on our provider's assessment of the scan. We will be in touch to make sure you have no questions and to schedule the recommended 3 month follow up scan.  Lung RADS 4 B:  indicates findings that are concerning. We will be in touch with you to schedule additional diagnostic testing based on our provider's  assessment of the scan.  Other options for assistance in smoking cessation (   As covered by your insurance benefits)  Hypnosis for smoking cessation  [employer] [phone]  Acupuncture for smoking cessation  [facility] [phone]   

## 2021-06-21 NOTE — Progress Notes (Signed)
Virtual Visit via Telephone Note ? ?I connected with Wendy Miller on 01/25/21 at  2:00 PM EST by telephone and verified that I am speaking with the correct person using two identifiers. ? ?Location: ?Patient: Home  ?Provider: Working from home ?  ?I discussed the limitations, risks, security and privacy concerns of performing an evaluation and management service by telephone and the availability of in person appointments. I also discussed with the patient that there may be a patient responsible charge related to this service. The patient expressed understanding and agreed to proceed. ? ?Shared Decision Making Visit Lung Cancer Screening Program ?([phone]) ? ? ?Eligibility: ?Age 56 y.o. ?Pack Years Smoking History Calculation 34 ?(# packs/per year x # years smoked) ?Recent History of coughing up blood  no ?Unexplained weight loss? no ?( >Than 15 pounds within the last 6 months ) ?Prior History Lung / other cancer no ?(Diagnosis within the last 5 years already requiring surveillance chest CT Scans). ?Smoking Status Current Smoker ?Former Smokers: Years since quit: NA ? Quit Date: NA ? ?Visit Components: ?Discussion included one or more decision making aids. yes ?Discussion included risk/benefits of screening. yes ?Discussion included potential follow up diagnostic testing for abnormal scans. yes ?Discussion included meaning and risk of over diagnosis. yes ?Discussion included meaning and risk of False Positives. yes ?Discussion included meaning of total radiation exposure. yes ? ?Counseling Included: ?Importance of adherence to annual lung cancer LDCT screening. yes ?Impact of comorbidities on ability to participate in the program. yes ?Ability and willingness to under diagnostic treatment. yes ? ?Smoking Cessation Counseling: ?Current Smokers:  ?Discussed importance of smoking cessation. yes ?Information about tobacco cessation classes and interventions provided to patient. yes ?Patient provided with "ticket" for LDCT  Scan. yes ?Symptomatic Patient. yes ? Counseling(Intermediate counseling: > three minutes) [id number] ?Diagnosis Code: Tobacco Use Z72.0 ?Asymptomatic Patient no ? Counseling NA ?Former Smokers:  ?Discussed the importance of maintaining cigarette abstinence. yes ?Diagnosis Code: Personal History of Nicotine Dependence. [id number] ?Information about tobacco cessation classes and interventions provided to patient. Yes ?Patient provided with "ticket" for LDCT Scan. yes ?Written Order for Lung Cancer Screening with LDCT placed in Epic. Yes ?(CT Chest Lung Cancer Screening Low Dose W/O CM) [id number] ?Z12.2-Screening of respiratory organs ?Z87.891-Personal history of nicotine dependence ? ? ?I spent 25 minutes of face to face time with her discussing the risks and benefits of lung cancer screening. We viewed a power point together that explained in detail the above noted topics. We took the time to pause the power point at intervals to allow for questions to be asked and answered to ensure understanding. We discussed that she had taken the single most powerful action possible to decrease her risk of developing lung cancer when she quit smoking. I counseled her to remain smoke free, and to contact me if she ever had the desire to smoke again so that I can provide resources and tools to help support the effort to remain smoke free. We discussed the time and location of the scan, and that either  Doroteo Glassman RN or I will call with the results within  24-48 hours of receiving them. She has my card and contact information in the event she needs to speak with me, in addition to a copy of the power point we reviewed as a resource. She verbalized understanding of all of the above and had no further questions upon leaving the office.  ? ? ? ?I explained to the patient that there has been a  high incidence of coronary artery disease noted on these exams. I explained that this is a non-gated exam therefore degree or severity cannot be  determined. This patient is on statin therapy. I have asked the patient to follow-up with their PCP regarding any incidental finding of coronary artery disease and management with diet or medication as they feel is clinically indicated. The patient verbalized understanding of the above and had no further questions. ? ? ?I spent 3 minutes counseling on smoking cessation and the health risks of continued tobacco abuse  ? ? ?Ulysse Siemen D. Harris, NP-C ?[facility] ?Personal contact information can be found on Amion  ?06/21/2021, 8:15 AM ? ? ? ? ? ? ? ? ? ?

## 2021-07-14 ENCOUNTER — Telehealth: Payer: Self-pay | Admitting: Acute Care

## 2021-07-14 DIAGNOSIS — F17210 Nicotine dependence, cigarettes, uncomplicated: Secondary | ICD-10-CM

## 2021-07-14 DIAGNOSIS — R911 Solitary pulmonary nodule: Secondary | ICD-10-CM

## 2021-07-14 NOTE — Telephone Encounter (Signed)
I have called the patient with the results of her low dose CT Chest. I explained her scan was read as a Lung RADS 0, incomplete Additional lung cancer screening CT images/or comparison to prior chest CT examinations is ?needed. ?I asked the patient if she had been sick at the time of her scan, and she states she has not been sick. No change in color of secretions , no fever , no change in breathing.  ?She is in agreement with a 3 month follow up scan. There is no need to treat patient prior to her next scan as she has been asymptomatic for illness.  ? ?Angelique Blonder, please place order for 3 month follow up and fax results to PCP with plan to rescan mid July 2023. Marland Kitchen  ?Thanks so much ?

## 2021-07-14 NOTE — Telephone Encounter (Signed)
Order placed for 3 month follow up CT and results/plan faxed to PCP.   ?

## 2021-09-08 ENCOUNTER — Other Ambulatory Visit (HOSPITAL_COMMUNITY): Payer: Self-pay

## 2021-09-19 ENCOUNTER — Other Ambulatory Visit (HOSPITAL_COMMUNITY): Payer: Self-pay

## 2021-09-23 ENCOUNTER — Encounter: Payer: Self-pay | Admitting: Acute Care

## 2021-10-05 ENCOUNTER — Other Ambulatory Visit (HOSPITAL_COMMUNITY): Payer: Self-pay

## 2021-10-05 MED ORDER — CLENPIQ 10-3.5-12 MG-GM -GM/175ML PO SOLN
175.0000 mL | Freq: Two times a day (BID) | ORAL | 0 refills | Status: AC
  Filled 2021-10-05: qty 350, 1d supply, fill #0

## 2021-10-14 ENCOUNTER — Other Ambulatory Visit (HOSPITAL_COMMUNITY): Payer: Self-pay

## 2022-01-13 ENCOUNTER — Other Ambulatory Visit (HOSPITAL_COMMUNITY): Payer: Self-pay

## 2022-01-25 ENCOUNTER — Other Ambulatory Visit (HOSPITAL_COMMUNITY): Payer: Self-pay

## 2022-04-11 ENCOUNTER — Ambulatory Visit
Admission: RE | Admit: 2022-04-11 | Discharge: 2022-04-11 | Disposition: A | Discharge status: Home / Self Care | Payer: BLUE CROSS/BLUE SHIELD | Source: Ambulatory Visit | Attending: Acute Care | Admitting: Acute Care

## 2022-04-11 DIAGNOSIS — F17210 Nicotine dependence, cigarettes, uncomplicated: Secondary | ICD-10-CM

## 2022-04-11 DIAGNOSIS — R911 Solitary pulmonary nodule: Secondary | ICD-10-CM

## 2022-04-13 ENCOUNTER — Other Ambulatory Visit: Payer: Self-pay | Admitting: Acute Care

## 2022-04-13 DIAGNOSIS — Z87891 Personal history of nicotine dependence: Secondary | ICD-10-CM

## 2022-04-13 DIAGNOSIS — F17210 Nicotine dependence, cigarettes, uncomplicated: Secondary | ICD-10-CM

## 2022-04-13 DIAGNOSIS — Z122 Encounter for screening for malignant neoplasm of respiratory organs: Secondary | ICD-10-CM

## 2022-04-26 ENCOUNTER — Other Ambulatory Visit (HOSPITAL_COMMUNITY): Payer: Self-pay

## 2022-04-26 MED ORDER — HYDROCHLOROTHIAZIDE 12.5 MG PO TABS
12.5000 mg | ORAL_TABLET | Freq: Every day | ORAL | 0 refills | Status: DC
  Filled 2022-04-26: qty 30, 30d supply, fill #0

## 2022-04-26 MED ORDER — ATORVASTATIN CALCIUM 10 MG PO TABS
10.0000 mg | ORAL_TABLET | Freq: Every day | ORAL | 0 refills | Status: DC
  Filled 2022-04-26: qty 30, 30d supply, fill #0

## 2022-05-28 ENCOUNTER — Other Ambulatory Visit (HOSPITAL_COMMUNITY): Payer: Self-pay

## 2022-05-29 ENCOUNTER — Other Ambulatory Visit (HOSPITAL_COMMUNITY): Payer: Self-pay

## 2022-05-29 MED ORDER — HYDROCHLOROTHIAZIDE 12.5 MG PO TABS
12.5000 mg | ORAL_TABLET | Freq: Every day | ORAL | 0 refills | Status: DC
  Filled 2022-05-29: qty 30, 30d supply, fill #0

## 2022-05-29 MED ORDER — ATORVASTATIN CALCIUM 10 MG PO TABS
10.0000 mg | ORAL_TABLET | Freq: Every day | ORAL | 0 refills | Status: DC
  Filled 2022-05-29: qty 30, 30d supply, fill #0

## 2022-05-30 ENCOUNTER — Other Ambulatory Visit (HOSPITAL_COMMUNITY): Payer: Self-pay

## 2022-06-08 ENCOUNTER — Other Ambulatory Visit (HOSPITAL_COMMUNITY): Payer: Self-pay

## 2022-06-23 IMAGING — CT CT CHEST LUNG CANCER SCREENING LOW DOSE W/O CM
2 of 5 series · 15 of 40 positions shown, 18 images · non-contrast
Comparison: None.

CLINICAL DATA: Current smoker with 35 pack-year history



[Series 4: lung 1.00 br44 cor · coronal · 0.60mm/px · 3 of 337 slices shown]
[im 68/337  lung]
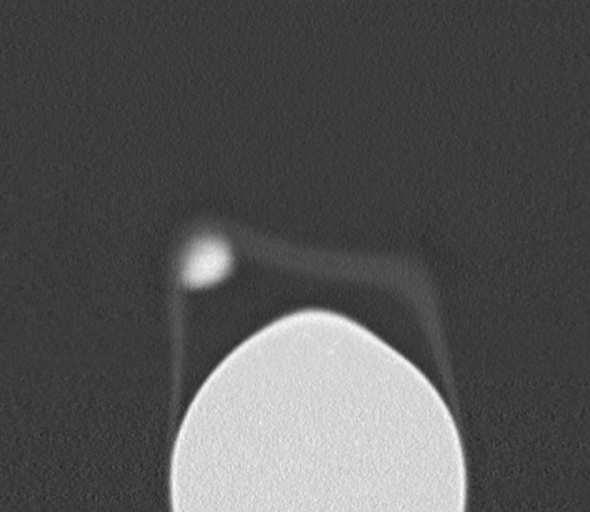
[im 135/337  lung]
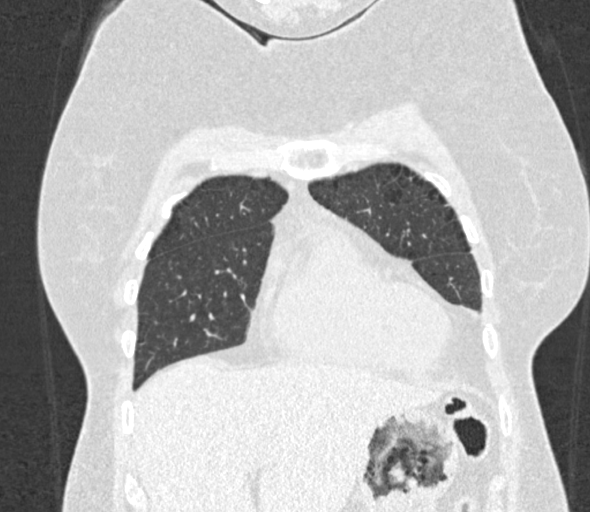
[im 202/337  lung]
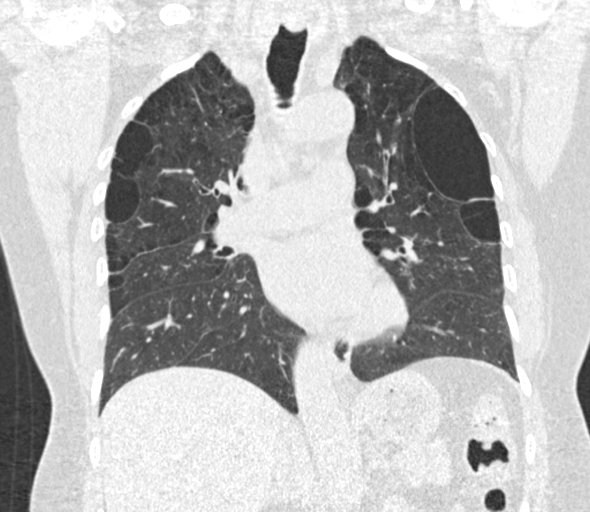

[Series 9: lung 1.00 br60 axial · axial · 0.67mm/px · z∈[-1320,-1043]mm · 12 of 305 slices shown, 15 images]
[im 14/305  mediastinal]
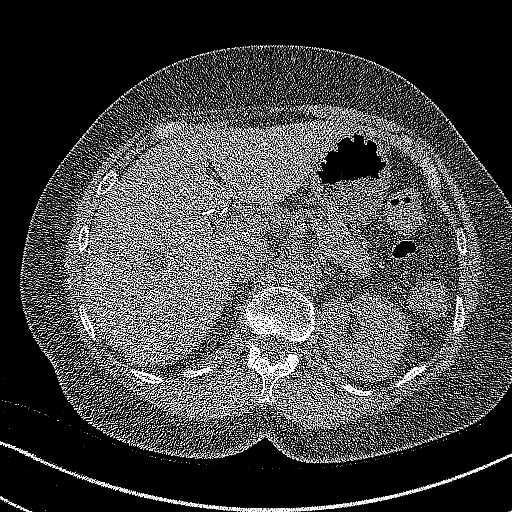
[im 14/305  lung]
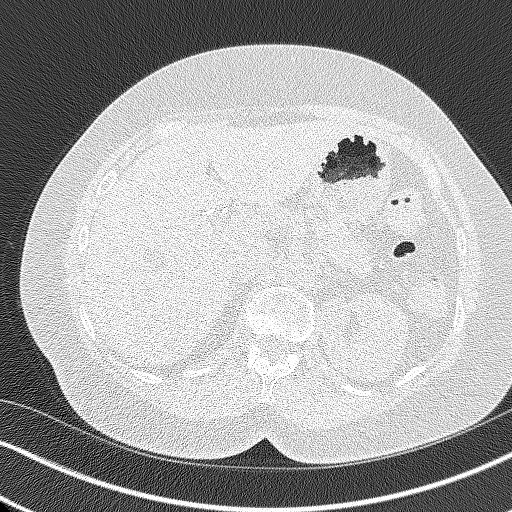
[im 42/305  lung]
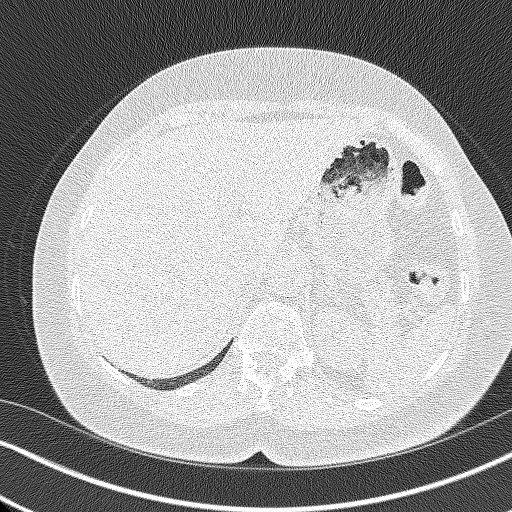
[im 70/305  lung]
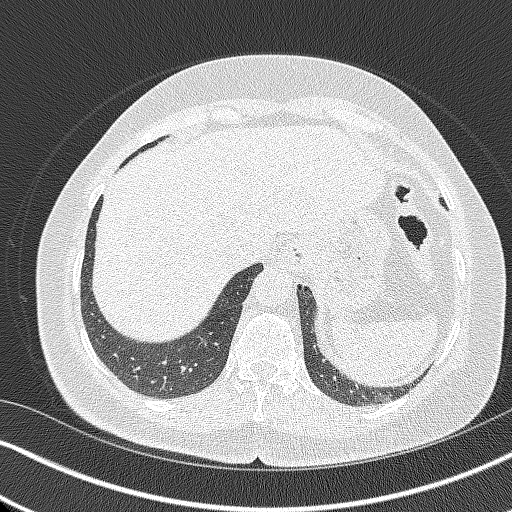
[im 97/305  lung]
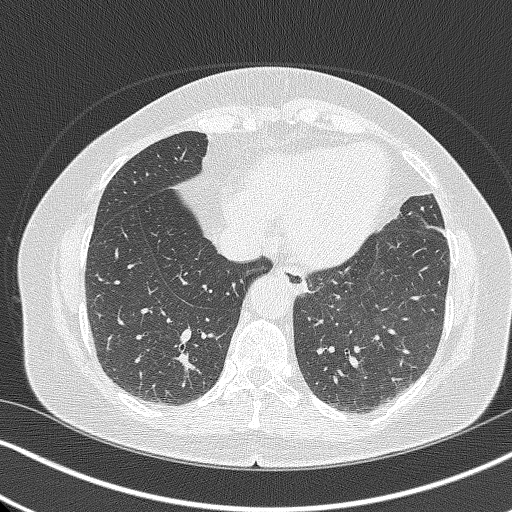
[im 111/305  mediastinal]
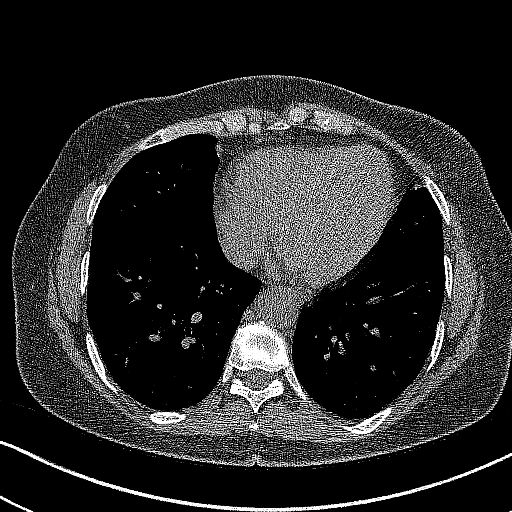
[im 111/305  lung]
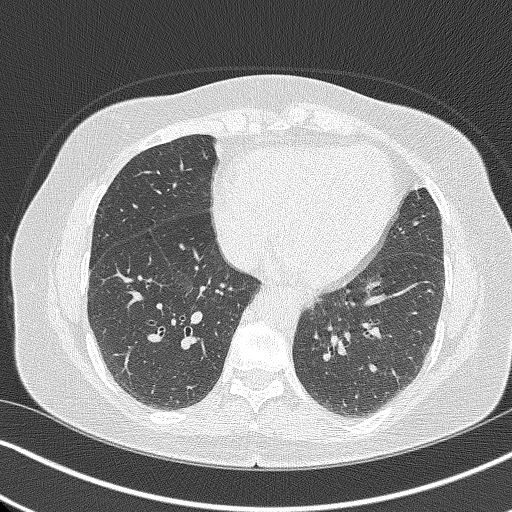
[im 139/305  lung]
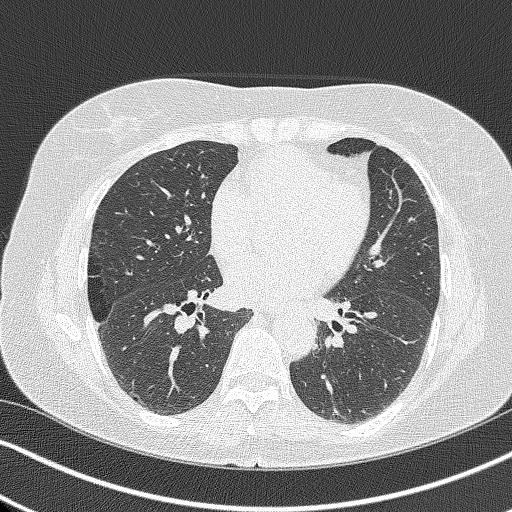
[im 166/305  lung]
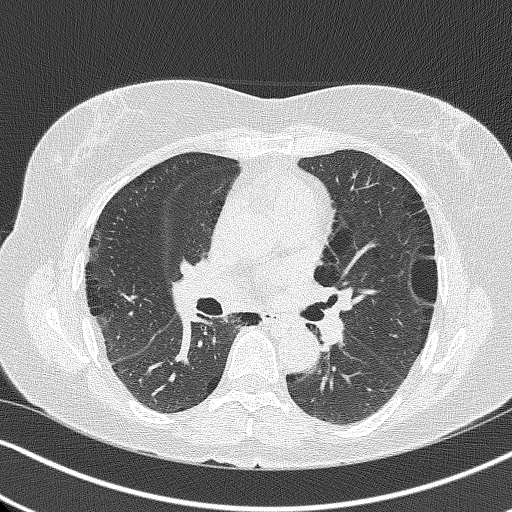
[im 194/305  lung]
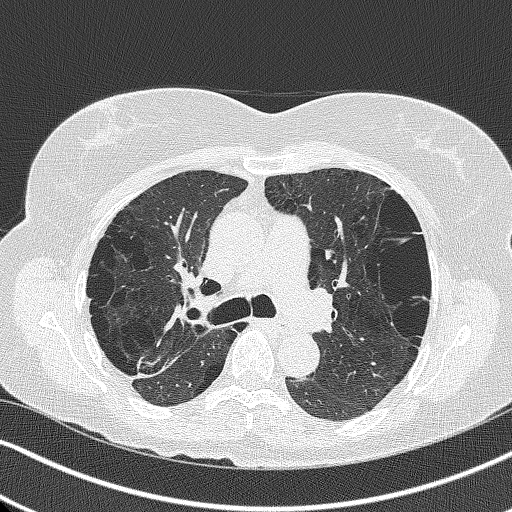
[im 208/305  mediastinal]
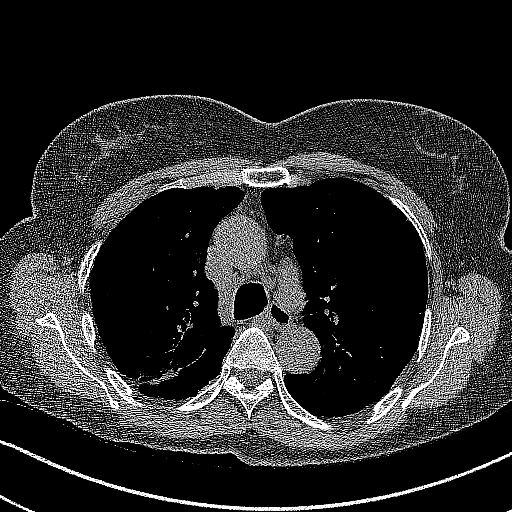
[im 208/305  lung]
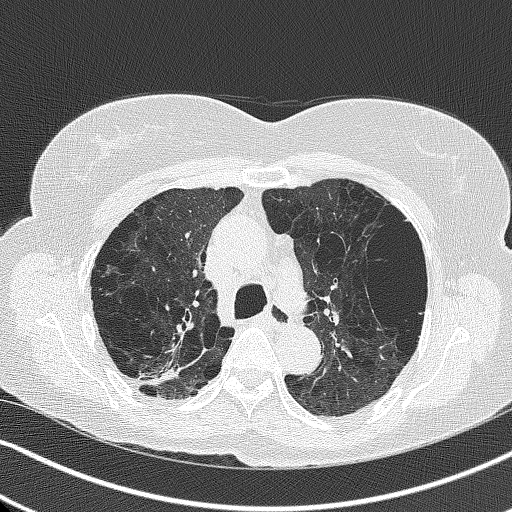
[im 235/305  lung]
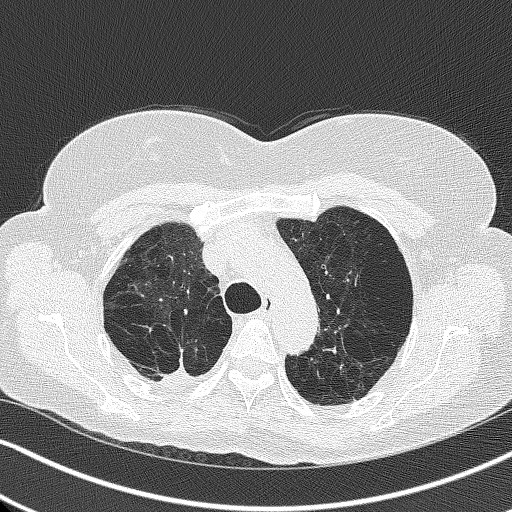
[im 263/305  lung]
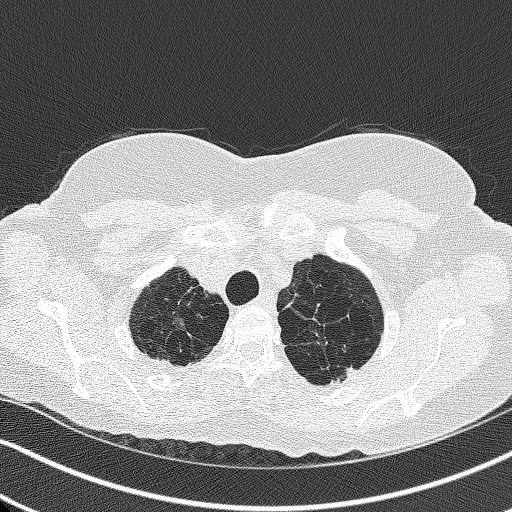
[im 291/305  lung]
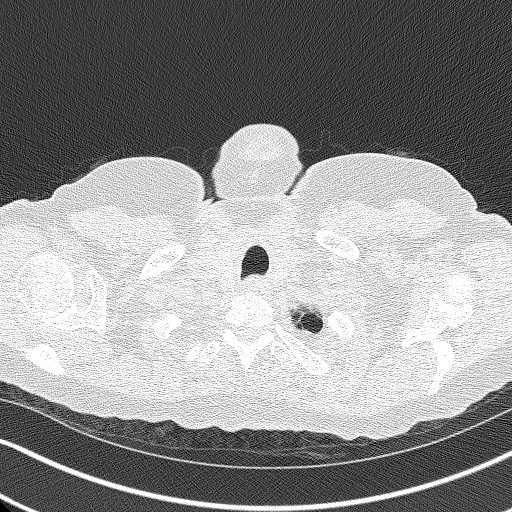

[15 of 40 positions shown; findings below may reference images not displayed]

FINDINGS: Cardiovascular: Normal heart size. No pericardial effusion. No
significant vascular abnormalities.

Mediastinum/Nodes: Small hiatal hernia. Thyroid is unremarkable. No
pathologically enlarged lymph nodes seen in the chest.

Lungs/Pleura: Centrilobular and paraseptal emphysema with left
apical blebs. Linear consolidation of the posterior right upper
lobe, likely sequela of prior infection. Small bilateral solid
pulmonary nodules. Largest is located in the right lower lobe and
measures 5.9 mm in mean diameter on image 249.

Upper Abdomen: Cholecystectomy clips. Low-attenuation liver lesions
which are likely simple cysts.

Musculoskeletal: No chest wall mass or suspicious bone lesions
identified.
IMPRESSION: 1. Linear consolidation of the posterior right upper lobe, likely
sequela of prior infection. Recommend follow-up chest CT in 3 months
to ensure stability. Lung-RADS 0, incomplete. Additional lung cancer
screening CT images/or comparison to prior chest CT examinations is
needed.
2. Aortic Atherosclerosis (7IN14-JN1.1) and Emphysema (7IN14-9OS.F).

## 2022-06-28 ENCOUNTER — Other Ambulatory Visit (HOSPITAL_COMMUNITY): Payer: Self-pay

## 2022-06-28 MED ORDER — HYDROCHLOROTHIAZIDE 12.5 MG PO TABS
12.5000 mg | ORAL_TABLET | Freq: Every day | ORAL | 0 refills | Status: DC
  Filled 2022-06-28: qty 30, 30d supply, fill #0

## 2022-07-25 ENCOUNTER — Other Ambulatory Visit (HOSPITAL_COMMUNITY): Payer: Self-pay

## 2022-07-25 MED ORDER — ATORVASTATIN CALCIUM 10 MG PO TABS
10.0000 mg | ORAL_TABLET | Freq: Every day | ORAL | 1 refills | Status: DC
  Filled 2022-07-25: qty 90, 90d supply, fill #0
  Filled 2022-10-18 – 2022-10-26 (×2): qty 90, 90d supply, fill #1

## 2022-07-25 MED ORDER — NICOTINE 14 MG/24HR TD PT24
14.0000 mg | MEDICATED_PATCH | Freq: Every day | TRANSDERMAL | 0 refills | Status: DC
  Filled 2022-07-25: qty 28, 28d supply, fill #0

## 2022-07-25 MED ORDER — HYDROCHLOROTHIAZIDE 12.5 MG PO TABS
12.5000 mg | ORAL_TABLET | Freq: Every day | ORAL | 2 refills | Status: DC
  Filled 2022-07-25: qty 90, 90d supply, fill #0
  Filled 2022-10-18 – 2022-10-26 (×2): qty 90, 90d supply, fill #1
  Filled 2023-01-10: qty 90, 90d supply, fill #2

## 2022-07-25 MED ORDER — MELOXICAM 15 MG PO TABS
15.0000 mg | ORAL_TABLET | Freq: Every day | ORAL | 0 refills | Status: DC | PRN
  Filled 2022-07-25: qty 30, 30d supply, fill #0

## 2022-07-26 ENCOUNTER — Other Ambulatory Visit (HOSPITAL_COMMUNITY): Payer: Self-pay

## 2022-07-27 ENCOUNTER — Telehealth: Payer: Self-pay

## 2022-07-27 ENCOUNTER — Other Ambulatory Visit: Payer: Self-pay | Admitting: Internal Medicine

## 2022-07-27 DIAGNOSIS — Z0000 Encounter for general adult medical examination without abnormal findings: Secondary | ICD-10-CM

## 2022-07-27 NOTE — Telephone Encounter (Signed)
Returned call to patient requesting to schedule annual LDCT.  Patient states we can 'disregard the request.'  She states she just talked with her PCP who said she "didn't need the CT' and they were doing some kind of paperwork for something else.  Patient is aware her annual LDCT is past due.  She will call if needs [place] to schedule

## 2022-08-01 ENCOUNTER — Other Ambulatory Visit (HOSPITAL_COMMUNITY): Payer: Self-pay

## 2022-08-01 MED ORDER — NAPROXEN 500 MG PO TABS
500.0000 mg | ORAL_TABLET | Freq: Two times a day (BID) | ORAL | 2 refills | Status: AC | PRN
  Filled 2022-08-01 – 2023-01-10 (×2): qty 60, 30d supply, fill #0

## 2022-08-09 ENCOUNTER — Other Ambulatory Visit (HOSPITAL_COMMUNITY): Payer: Self-pay

## 2022-08-11 ENCOUNTER — Other Ambulatory Visit (HOSPITAL_COMMUNITY): Payer: Self-pay

## 2022-08-15 ENCOUNTER — Ambulatory Visit
Admission: RE | Admit: 2022-08-15 | Discharge: 2022-08-15 | Disposition: A | Discharge status: Home / Self Care | Payer: BC Managed Care – PPO | Source: Ambulatory Visit | Attending: Internal Medicine | Admitting: Internal Medicine

## 2022-08-15 DIAGNOSIS — Z0000 Encounter for general adult medical examination without abnormal findings: Secondary | ICD-10-CM

## 2022-09-04 ENCOUNTER — Other Ambulatory Visit (HOSPITAL_COMMUNITY): Payer: Self-pay

## 2022-09-04 MED ORDER — MUPIROCIN 2 % EX OINT
TOPICAL_OINTMENT | CUTANEOUS | 0 refills | Status: AC
  Filled 2022-09-04: qty 22, 14d supply, fill #0

## 2022-09-12 ENCOUNTER — Other Ambulatory Visit (HOSPITAL_COMMUNITY): Payer: Self-pay

## 2022-10-18 ENCOUNTER — Other Ambulatory Visit (HOSPITAL_COMMUNITY): Payer: Self-pay

## 2022-10-24 ENCOUNTER — Other Ambulatory Visit (HOSPITAL_COMMUNITY): Payer: Self-pay

## 2022-10-26 ENCOUNTER — Other Ambulatory Visit (HOSPITAL_COMMUNITY): Payer: Self-pay

## 2023-01-10 ENCOUNTER — Other Ambulatory Visit (HOSPITAL_COMMUNITY): Payer: Self-pay

## 2023-01-11 ENCOUNTER — Other Ambulatory Visit: Payer: Self-pay

## 2023-01-11 ENCOUNTER — Other Ambulatory Visit (HOSPITAL_COMMUNITY): Payer: Self-pay

## 2023-01-11 MED ORDER — HYDROCHLOROTHIAZIDE 12.5 MG PO TABS
12.5000 mg | ORAL_TABLET | Freq: Every morning | ORAL | 2 refills | Status: DC
  Filled 2023-01-11 – 2023-03-19 (×2): qty 90, 90d supply, fill #0
  Filled 2023-06-15: qty 90, 90d supply, fill #1
  Filled 2023-11-29 – 2023-12-28 (×3): qty 90, 90d supply, fill #2

## 2023-01-11 MED ORDER — NICOTINE 14 MG/24HR TD PT24
14.0000 mg | MEDICATED_PATCH | Freq: Every day | TRANSDERMAL | 0 refills | Status: DC
  Filled 2023-01-11: qty 28, 28d supply, fill #0

## 2023-01-11 MED ORDER — ATORVASTATIN CALCIUM 10 MG PO TABS
10.0000 mg | ORAL_TABLET | Freq: Every day | ORAL | 1 refills | Status: DC
  Filled 2023-01-11: qty 90, 90d supply, fill #0
  Filled 2023-04-25: qty 90, 90d supply, fill #1

## 2023-01-11 MED ORDER — MELOXICAM 15 MG PO TABS
ORAL_TABLET | ORAL | 0 refills | Status: DC
  Filled 2023-01-11: qty 30, 30d supply, fill #0

## 2023-01-12 ENCOUNTER — Other Ambulatory Visit (HOSPITAL_COMMUNITY): Payer: Self-pay

## 2023-01-17 ENCOUNTER — Other Ambulatory Visit (HOSPITAL_COMMUNITY): Payer: Self-pay

## 2023-01-18 ENCOUNTER — Other Ambulatory Visit (HOSPITAL_COMMUNITY): Payer: Self-pay

## 2023-01-19 ENCOUNTER — Other Ambulatory Visit (HOSPITAL_COMMUNITY): Payer: Self-pay

## 2023-01-23 ENCOUNTER — Other Ambulatory Visit (HOSPITAL_COMMUNITY): Payer: Self-pay

## 2023-01-23 MED ORDER — DICLOFENAC SODIUM 1 % EX GEL
CUTANEOUS | 1 refills | Status: AC
  Filled 2023-01-23: qty 100, 4d supply, fill #0
  Filled 2023-03-19: qty 100, 12d supply, fill #0
  Filled 2023-04-25: qty 100, 12d supply, fill #1

## 2023-02-09 ENCOUNTER — Other Ambulatory Visit (HOSPITAL_COMMUNITY): Payer: Self-pay

## 2023-02-19 ENCOUNTER — Other Ambulatory Visit (HOSPITAL_COMMUNITY): Payer: Self-pay

## 2023-03-09 ENCOUNTER — Other Ambulatory Visit: Payer: Self-pay | Admitting: Acute Care

## 2023-03-09 DIAGNOSIS — F17210 Nicotine dependence, cigarettes, uncomplicated: Secondary | ICD-10-CM

## 2023-03-09 DIAGNOSIS — Z122 Encounter for screening for malignant neoplasm of respiratory organs: Secondary | ICD-10-CM

## 2023-03-09 DIAGNOSIS — Z87891 Personal history of nicotine dependence: Secondary | ICD-10-CM

## 2023-03-19 ENCOUNTER — Other Ambulatory Visit: Payer: Self-pay

## 2023-03-19 ENCOUNTER — Other Ambulatory Visit (HOSPITAL_COMMUNITY): Payer: Self-pay

## 2023-04-18 ENCOUNTER — Other Ambulatory Visit: Payer: BC Managed Care – PPO

## 2023-04-24 ENCOUNTER — Inpatient Hospital Stay: Admission: RE | Admit: 2023-04-24 | Payer: BC Managed Care – PPO | Source: Ambulatory Visit

## 2023-04-25 ENCOUNTER — Other Ambulatory Visit (HOSPITAL_COMMUNITY): Payer: Self-pay

## 2023-04-25 MED ORDER — NICOTINE 14 MG/24HR TD PT24
14.0000 mg | MEDICATED_PATCH | Freq: Every day | TRANSDERMAL | 0 refills | Status: DC
  Filled 2023-04-25: qty 28, 28d supply, fill #0

## 2023-04-25 MED ORDER — HYDROCHLOROTHIAZIDE 12.5 MG PO TABS
12.5000 mg | ORAL_TABLET | Freq: Every day | ORAL | 2 refills | Status: AC
  Filled 2023-06-15 – 2023-08-28 (×4): qty 90, 90d supply, fill #0
  Filled 2023-11-29: qty 90, 90d supply, fill #1
  Filled 2024-02-13: qty 90, 90d supply, fill #2

## 2023-05-23 ENCOUNTER — Other Ambulatory Visit (HOSPITAL_COMMUNITY): Payer: Self-pay

## 2023-05-25 ENCOUNTER — Encounter: Payer: Self-pay | Admitting: Acute Care

## 2023-05-31 ENCOUNTER — Inpatient Hospital Stay: Admission: RE | Admit: 2023-05-31 | Source: Ambulatory Visit

## 2023-06-15 ENCOUNTER — Other Ambulatory Visit: Payer: Self-pay

## 2023-06-15 ENCOUNTER — Other Ambulatory Visit (HOSPITAL_COMMUNITY): Payer: Self-pay

## 2023-06-15 MED ORDER — NICOTINE 14 MG/24HR TD PT24
14.0000 mg | MEDICATED_PATCH | Freq: Every day | TRANSDERMAL | 0 refills | Status: DC
  Filled 2023-06-15: qty 28, 28d supply, fill #0

## 2023-06-19 ENCOUNTER — Encounter: Payer: Self-pay | Admitting: Acute Care

## 2023-06-26 ENCOUNTER — Ambulatory Visit
Admission: RE | Admit: 2023-06-26 | Discharge: 2023-06-26 | Disposition: A | Discharge status: Home / Self Care | Source: Ambulatory Visit | Attending: Acute Care | Admitting: Acute Care

## 2023-06-26 DIAGNOSIS — F17210 Nicotine dependence, cigarettes, uncomplicated: Secondary | ICD-10-CM

## 2023-06-26 DIAGNOSIS — Z87891 Personal history of nicotine dependence: Secondary | ICD-10-CM

## 2023-06-26 DIAGNOSIS — Z122 Encounter for screening for malignant neoplasm of respiratory organs: Secondary | ICD-10-CM

## 2023-07-30 ENCOUNTER — Other Ambulatory Visit (HOSPITAL_COMMUNITY): Payer: Self-pay

## 2023-07-30 MED ORDER — NICOTINE 14 MG/24HR TD PT24
14.0000 mg | MEDICATED_PATCH | Freq: Every day | TRANSDERMAL | 0 refills | Status: DC
  Filled 2023-07-30: qty 28, 28d supply, fill #0
  Filled 2023-07-30: qty 14, 14d supply, fill #0
  Filled 2023-08-28: qty 28, 28d supply, fill #0

## 2023-07-30 MED ORDER — ATORVASTATIN CALCIUM 10 MG PO TABS
10.0000 mg | ORAL_TABLET | Freq: Every day | ORAL | 1 refills | Status: DC
  Filled 2023-07-30 – 2023-08-28 (×2): qty 90, 90d supply, fill #0
  Filled 2023-11-29: qty 90, 90d supply, fill #1

## 2023-08-01 ENCOUNTER — Telehealth: Payer: Self-pay

## 2023-08-01 NOTE — Telephone Encounter (Addendum)
 Please call patient and advise a 6 month f/u scan is due per Margit Shelling, NP. Since last scan there has been growth to two nodules. One previously 13.8 mm now 14.0 mm and one 5.4 mm now 6.3 mm nodule. She will be due for a repeat scan October 2025.  IMPRESSION: 1. Lung-RADS 2, benign appearance or behavior. Continue annual screening with low-dose chest CT without contrast in 12 months. 2. Small hiatal hernia. 3. Aortic Atherosclerosis (ICD10-I70.0) and Emphysema (ICD10-J43.9).

## 2023-08-02 ENCOUNTER — Other Ambulatory Visit: Payer: Self-pay

## 2023-08-02 DIAGNOSIS — R911 Solitary pulmonary nodule: Secondary | ICD-10-CM

## 2023-08-02 DIAGNOSIS — Z122 Encounter for screening for malignant neoplasm of respiratory organs: Secondary | ICD-10-CM

## 2023-08-02 DIAGNOSIS — Z87891 Personal history of nicotine dependence: Secondary | ICD-10-CM

## 2023-08-02 NOTE — Telephone Encounter (Signed)
 Spoke with patient and reviewed results. She is in agreement to repeat scan in 6 months to evaluate nodule stability. Reviewed aortic atherosclerosis. She is on a statin. 6 month order placed. She is aware she will get a call closer to October to schedule CT. Reminder set. Results and plan sent to PCP.

## 2023-08-08 ENCOUNTER — Other Ambulatory Visit (HOSPITAL_COMMUNITY): Payer: Self-pay

## 2023-08-28 ENCOUNTER — Other Ambulatory Visit: Payer: Self-pay

## 2023-08-28 ENCOUNTER — Other Ambulatory Visit (HOSPITAL_COMMUNITY): Payer: Self-pay

## 2023-09-17 ENCOUNTER — Other Ambulatory Visit (HOSPITAL_COMMUNITY): Payer: Self-pay | Admitting: Internal Medicine

## 2023-09-17 ENCOUNTER — Other Ambulatory Visit (HOSPITAL_COMMUNITY): Payer: Self-pay

## 2023-09-17 DIAGNOSIS — Z136 Encounter for screening for cardiovascular disorders: Secondary | ICD-10-CM

## 2023-09-17 MED ORDER — DICLOFENAC SODIUM 1 % EX GEL
CUTANEOUS | 1 refills | Status: AC
  Filled 2023-09-17 – 2023-10-04 (×2): qty 100, 30d supply, fill #0
  Filled 2023-11-29 – 2024-02-13 (×3): qty 100, 30d supply, fill #1

## 2023-09-18 ENCOUNTER — Other Ambulatory Visit: Payer: Self-pay | Admitting: Internal Medicine

## 2023-09-18 DIAGNOSIS — Z1231 Encounter for screening mammogram for malignant neoplasm of breast: Secondary | ICD-10-CM

## 2023-09-24 ENCOUNTER — Other Ambulatory Visit (HOSPITAL_COMMUNITY)

## 2023-09-24 ENCOUNTER — Encounter (HOSPITAL_COMMUNITY): Payer: Self-pay

## 2023-09-26 ENCOUNTER — Other Ambulatory Visit (HOSPITAL_COMMUNITY): Payer: Self-pay

## 2023-09-28 ENCOUNTER — Ambulatory Visit: Admitting: Orthopaedic Surgery

## 2023-10-04 ENCOUNTER — Ambulatory Visit

## 2023-10-04 ENCOUNTER — Other Ambulatory Visit (HOSPITAL_COMMUNITY): Payer: Self-pay

## 2023-10-18 ENCOUNTER — Ambulatory Visit
Admission: RE | Admit: 2023-10-18 | Discharge: 2023-10-18 | Disposition: A | Discharge status: Home / Self Care | Source: Ambulatory Visit | Attending: Internal Medicine

## 2023-10-18 DIAGNOSIS — Z1231 Encounter for screening mammogram for malignant neoplasm of breast: Secondary | ICD-10-CM

## 2023-11-29 ENCOUNTER — Other Ambulatory Visit: Payer: Self-pay

## 2023-11-29 ENCOUNTER — Other Ambulatory Visit (HOSPITAL_COMMUNITY): Payer: Self-pay

## 2023-11-29 MED ORDER — MELOXICAM 15 MG PO TABS
ORAL_TABLET | ORAL | 0 refills | Status: DC
  Filled 2023-11-29: qty 30, 30d supply, fill #0

## 2023-11-29 MED ORDER — NICOTINE 14 MG/24HR TD PT24
14.0000 mg | MEDICATED_PATCH | Freq: Every day | TRANSDERMAL | 0 refills | Status: DC
  Filled 2023-11-29: qty 28, 28d supply, fill #0

## 2023-12-04 ENCOUNTER — Other Ambulatory Visit (HOSPITAL_COMMUNITY): Payer: Self-pay

## 2023-12-27 ENCOUNTER — Inpatient Hospital Stay: Admission: RE | Admit: 2023-12-27 | Source: Ambulatory Visit

## 2023-12-28 ENCOUNTER — Telehealth: Payer: Self-pay

## 2023-12-28 ENCOUNTER — Other Ambulatory Visit (HOSPITAL_COMMUNITY): Payer: Self-pay

## 2023-12-28 ENCOUNTER — Encounter: Payer: Self-pay | Admitting: Emergency Medicine

## 2023-12-28 MED ORDER — NICOTINE 14 MG/24HR TD PT24
14.0000 mg | MEDICATED_PATCH | Freq: Every day | TRANSDERMAL | 0 refills | Status: DC
  Filled 2023-12-28: qty 28, 28d supply, fill #0

## 2023-12-28 MED ORDER — MELOXICAM 15 MG PO TABS
ORAL_TABLET | ORAL | 0 refills | Status: AC
  Filled 2023-12-28: qty 30, 30d supply, fill #0

## 2023-12-28 NOTE — Telephone Encounter (Signed)
 Pt was a no show for LDCT 12/27/2023, LVM to call back and reschedule. Letter mailed to home.

## 2023-12-28 NOTE — Telephone Encounter (Signed)
 Patient no-showed her LDCT appt on 12/27/2023. Letter mailed to patient about her missed appointment and advised to call back to reschedule.

## 2023-12-31 ENCOUNTER — Other Ambulatory Visit (HOSPITAL_COMMUNITY): Payer: Self-pay

## 2024-01-01 ENCOUNTER — Other Ambulatory Visit (HOSPITAL_COMMUNITY): Payer: Self-pay

## 2024-01-05 ENCOUNTER — Other Ambulatory Visit (HOSPITAL_COMMUNITY): Payer: Self-pay

## 2024-01-07 ENCOUNTER — Other Ambulatory Visit: Payer: Self-pay

## 2024-01-14 ENCOUNTER — Encounter: Payer: Self-pay | Admitting: Radiology

## 2024-01-15 ENCOUNTER — Other Ambulatory Visit (HOSPITAL_COMMUNITY): Payer: Self-pay

## 2024-02-13 ENCOUNTER — Other Ambulatory Visit: Payer: Self-pay

## 2024-02-13 ENCOUNTER — Other Ambulatory Visit (HOSPITAL_COMMUNITY): Payer: Self-pay

## 2024-02-13 MED ORDER — ATORVASTATIN CALCIUM 10 MG PO TABS
10.0000 mg | ORAL_TABLET | Freq: Every day | ORAL | 0 refills | Status: AC
  Filled 2024-02-13: qty 90, 90d supply, fill #0

## 2024-02-13 MED ORDER — NAPROXEN 500 MG PO TABS
ORAL_TABLET | ORAL | 2 refills | Status: AC
  Filled 2024-02-13: qty 60, 30d supply, fill #0

## 2024-02-13 MED ORDER — HYDROCHLOROTHIAZIDE 12.5 MG PO TABS
12.5000 mg | ORAL_TABLET | Freq: Every day | ORAL | 0 refills | Status: AC
  Filled 2024-02-13 – 2024-02-20 (×2): qty 90, 90d supply, fill #0

## 2024-02-13 MED ORDER — HYDROCHLOROTHIAZIDE 12.5 MG PO TABS
12.5000 mg | ORAL_TABLET | Freq: Every morning | ORAL | 0 refills | Status: AC
  Filled 2024-02-13: qty 90, 90d supply, fill #0

## 2024-02-13 MED ORDER — NICOTINE 14 MG/24HR TD PT24
14.0000 mg | MEDICATED_PATCH | Freq: Every day | TRANSDERMAL | 0 refills | Status: DC
  Filled 2024-02-13: qty 28, 28d supply, fill #0

## 2024-02-19 ENCOUNTER — Other Ambulatory Visit (HOSPITAL_COMMUNITY): Payer: Self-pay

## 2024-02-19 ENCOUNTER — Other Ambulatory Visit: Payer: Self-pay

## 2024-02-20 ENCOUNTER — Other Ambulatory Visit (HOSPITAL_COMMUNITY): Payer: Self-pay

## 2024-02-22 ENCOUNTER — Other Ambulatory Visit (HOSPITAL_COMMUNITY): Payer: Self-pay

## 2024-04-16 ENCOUNTER — Other Ambulatory Visit (HOSPITAL_COMMUNITY): Payer: Self-pay

## 2024-04-16 MED ORDER — AZITHROMYCIN 250 MG PO TABS
ORAL_TABLET | ORAL | 0 refills | Status: AC
  Filled 2024-04-16: qty 6, 5d supply, fill #0

## 2024-04-16 MED ORDER — ALBUTEROL SULFATE HFA 108 (90 BASE) MCG/ACT IN AERS
1.0000 | INHALATION_SPRAY | RESPIRATORY_TRACT | 1 refills | Status: AC
  Filled 2024-04-16: qty 6.7, 20d supply, fill #0

## 2024-04-16 MED ORDER — NICOTINE 14 MG/24HR TD PT24
14.0000 mg | MEDICATED_PATCH | Freq: Every day | TRANSDERMAL | 0 refills | Status: AC
  Filled 2024-04-16: qty 28, 28d supply, fill #0

## 2024-04-16 MED ORDER — PREDNISONE 20 MG PO TABS
ORAL_TABLET | ORAL | 0 refills | Status: AC
  Filled 2024-04-16: qty 12, 6d supply, fill #0

## 2024-04-17 ENCOUNTER — Other Ambulatory Visit (HOSPITAL_COMMUNITY): Payer: Self-pay
